# Patient Record
Sex: Female | Born: 2000 | Race: Black or African American | Hispanic: No | Marital: Single | Smoking: Never smoker
Health system: Southern US, Community
[De-identification: ages and names within clinical notes are randomized; demographics above are authoritative.]

## PROBLEM LIST (undated history)

## (undated) DIAGNOSIS — Z789 Other specified health status: Secondary | ICD-10-CM

## (undated) DIAGNOSIS — L309 Dermatitis, unspecified: Secondary | ICD-10-CM

## (undated) DIAGNOSIS — R569 Unspecified convulsions: Secondary | ICD-10-CM

## (undated) HISTORY — PX: OTHER SURGICAL HISTORY: SHX169

## (undated) HISTORY — DX: Dermatitis, unspecified: L30.9

## (undated) HISTORY — PX: FOOT SURGERY: SHX648

---
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
## 2018-04-25 ENCOUNTER — Other Ambulatory Visit: Payer: Self-pay

## 2018-04-25 ENCOUNTER — Encounter (HOSPITAL_COMMUNITY): Payer: Self-pay | Admitting: Emergency Medicine

## 2018-04-25 ENCOUNTER — Emergency Department (HOSPITAL_COMMUNITY)
Admission: EM | Admit: 2018-04-25 | Discharge: 2018-04-25 | Disposition: A | Discharge status: Home / Self Care | Payer: 59 | Attending: Emergency Medicine | Admitting: Emergency Medicine

## 2018-04-25 DIAGNOSIS — N760 Acute vaginitis: Secondary | ICD-10-CM | POA: Diagnosis not present

## 2018-04-25 DIAGNOSIS — N939 Abnormal uterine and vaginal bleeding, unspecified: Secondary | ICD-10-CM

## 2018-04-25 DIAGNOSIS — B9689 Other specified bacterial agents as the cause of diseases classified elsewhere: Secondary | ICD-10-CM | POA: Diagnosis not present

## 2018-04-25 LAB — CBC WITH DIFFERENTIAL/PLATELET
Abs Immature Granulocytes: 0 10*3/uL (ref 0.0–0.1)
BASOS PCT: 0 %
Basophils Absolute: 0 10*3/uL (ref 0.0–0.1)
EOS ABS: 0 10*3/uL (ref 0.0–0.7)
Eosinophils Relative: 1 %
HCT: 32.4 % — ABNORMAL LOW (ref 36.0–46.0)
Hemoglobin: 10.9 g/dL — ABNORMAL LOW (ref 12.0–15.0)
Immature Granulocytes: 0 %
Lymphocytes Relative: 23 %
Lymphs Abs: 1.5 10*3/uL (ref 0.7–4.0)
MCH: 29 pg (ref 26.0–34.0)
MCHC: 33.6 g/dL (ref 30.0–36.0)
MCV: 86.2 fL (ref 78.0–100.0)
MONOS PCT: 8 %
Monocytes Absolute: 0.5 10*3/uL (ref 0.1–1.0)
Neutro Abs: 4.4 10*3/uL (ref 1.7–7.7)
Neutrophils Relative %: 68 %
PLATELETS: 252 10*3/uL (ref 150–400)
RBC: 3.76 MIL/uL — ABNORMAL LOW (ref 3.87–5.11)
RDW: 12.3 % (ref 11.5–15.5)
WBC: 6.5 10*3/uL (ref 4.0–10.5)

## 2018-04-25 LAB — BASIC METABOLIC PANEL
Anion gap: 8 (ref 5–15)
BUN: 6 mg/dL (ref 6–20)
CALCIUM: 9.1 mg/dL (ref 8.9–10.3)
CO2: 25 mmol/L (ref 22–32)
CREATININE: 0.59 mg/dL (ref 0.44–1.00)
Chloride: 104 mmol/L (ref 101–111)
GFR calc non Af Amer: >60 mL/min (ref >60)
GLUCOSE: 82 mg/dL (ref 65–99)
Potassium: 3.5 mmol/L (ref 3.5–5.1)
Sodium: 137 mmol/L (ref 135–145)

## 2018-04-25 LAB — WET PREP, GENITAL
Sperm: NONE SEEN
Trich, Wet Prep: NONE SEEN
Yeast Wet Prep HPF POC: NONE SEEN

## 2018-04-25 LAB — I-STAT BETA HCG BLOOD, ED (MC, WL, AP ONLY): I-stat hCG, quantitative: <5 m[IU]/mL (ref <5)

## 2018-04-25 MED ORDER — METRONIDAZOLE 500 MG PO TABS: 500.0000 mg | ORAL_TABLET | Freq: Two times a day (BID) | ORAL | 0 refills | Status: DC

## 2018-04-25 MED ORDER — PROVERA 10 MG PO TABS: 20.0000 mg | ORAL_TABLET | Freq: Three times a day (TID) | ORAL | 0 refills | Status: DC

## 2018-04-25 NOTE — ED Provider Notes (Signed)
[facility] EMERGENCY DEPARTMENT Provider Note   CSN: [id number] Arrival date & time: 04/25/18  1813     History   Chief Complaint Chief Complaint  Patient presents with  . Vaginal Bleeding    HPI Lauren Adams is a 17 y.o. female.  HPI   17 year old female presents today with complaints of vaginal bleeding.  Patient notes she had 3 days of vaginal bleeding on April 21.  She notes this was not during her normal menstrual cycle.  She notes she came back on shortly after on April 28 and has had vaginal bleeding since that time.  Patient notes heavy vaginal bleeding every day, she notes that several pads per day.  She reports very minor abdominal cramping, no severe abdominal pain, no upper abdominal pain fever chills.  Patient notes an March she was very stressed, but notes since then she has not been.  She denies any history of the same.  She notes that she was on birth control last year but is no longer taking it.  Patient denies any vaginal discharge.  She reports last sexual activity in March with female partner.  She has a follow-up at the beginning of June with an OB/GYN.    Past Medical History:  Diagnosis Date  . Medical history non-contributory   . Seizures [facility])     Patient Active Problem List   Diagnosis Date Noted  . Conversion disorder with abnormal movement   . Adjustment disorder with other symptom   . VISUAL ACUITY, DECREASED 09/09/2010  . CONSTIPATION, INTERMITTENT 09/09/2010    No past surgical history on file.   OB History   None      Home Medications    Prior to Admission medications   Medication Sig Start Date End Date Taking? Authorizing Provider  metroNIDAZOLE (FLAGYL) 500 MG tablet Take 1 tablet (500 mg total) by mouth 2 (two) times daily. 04/25/18   Kalaysia Demonbreun, Tinnie Gens, PA-C  PROVERA 10 MG tablet Take 2 tablets (20 mg total) by mouth 3 (three) times daily. 04/25/18   Eyvonne Mechanic, PA-C    Family History Family History    Problem Relation Age of Onset  . Carpal tunnel syndrome Mother   . Hypertension Maternal Aunt   . Kidney disease Maternal Aunt   . Diabetes Maternal Aunt   . Diabetes Maternal Grandmother   . Hypertension Maternal Grandmother   . Kidney disease Maternal Grandmother     Social History Social History   Tobacco Use  . Smoking status: Never Smoker  . Smokeless tobacco: Never Used  Substance Use Topics  . Alcohol use: No    Alcohol/week: 0.0 oz  . Drug use: No     Allergies   Patient has no known allergies.   Review of Systems Review of Systems  All other systems reviewed and are negative.    Physical Exa Updated Vital Signs BP 107/67 (BP Location: Right Arm)   Pulse 81   Temp 98.1 F (36.7 C) (Oral)   Resp 16   Wt 70.8 kg (156 lb)   LMP 04/25/2018   SpO2 99%   Physical Exam  Constitutional: She is oriented to person, place, and time. She appears well-developed and well-nourished.  HENT:  Head: Normocephalic and atraumatic.  Eyes: Pupils are equal, round, and reactive to light. Conjunctivae are normal. Right eye exhibits no discharge. Left eye exhibits no discharge. No scleral icterus.  Neck: Normal range of motion. No JVD present. No tracheal deviation present.  Pulmonary/Chest: Effort normal. No stridor.  Genitourinary:  Genitourinary Comments: Blood noted in the vaginal vault with cervix is origin, no lacerations, no cervical motion tenderness adnexal tenderness or masses  Neurological: She is alert and oriented to person, place, and time. Coordination normal.  Psychiatric: She has a normal mood and affect. Her behavior is normal. Judgment and thought content normal.  Nursing note and vitals reviewed.    ED Treatments / Results  Labs (all labs ordered are listed, but only abnormal results are displayed) Labs Reviewed  WET PREP, GENITAL - Abnormal; Notable for the following components:      Result Value   Clue Cells Wet Prep HPF POC PRESENT (*)    WBC,  Wet Prep HPF POC MODERATE (*)    All other components within normal limits  CBC WITH DIFFERENTIAL/PLATELET - Abnormal; Notable for the following components:   RBC 3.76 (*)    Hemoglobin 10.9 (*)    HCT 32.4 (*)    All other components within normal limits  BASIC METABOLIC PANEL  I-STAT BETA HCG BLOOD, ED ([facility], [facility], [facility] ONLY)  GC/CHLAMYDIA PROBE AMP ([place]) NOT AT [facility]    EKG None  Radiology No results found.  Procedures Procedures (including critical care time)  Medications Ordered in ED Medications - No data to display   Initial Impression / Assessment and Plan / ED Course  I have reviewed the triage vital signs and the nursing notes.  Pertinent labs & imaging results that were available during my care of the patient were reviewed by me and considered in my medical decision making (see chart for details).     17 year old female presents today with vaginal bleeding is well-appearing in no acute distress.  She has a hemoglobin of 10.9, no recent for parison, she has no signs of significant blood loss.  Patient does have clue cells on wet prep and will be treated for bacterial vaginosis.  She has a negative pregnancy test.  Patient has no concerning signs or symptoms today, she will be treated with Provera for 1 week with close outpatient follow-up, she is given strict return precautions, she verbalized understanding and agreement to today's plan had no further questions or concerns.  Final Clinical Impressions(s) / ED Diagnoses   Final diagnoses:  Abnormal uterine bleeding  Bacterial vaginosis    ED Discharge Orders        Ordered    PROVERA 10 MG tablet  3 times daily     04/25/18 2150    metroNIDAZOLE (FLAGYL) 500 MG tablet  2 times daily     04/25/18 2159       Eyvonne Mechanic, PA-C 04/27/18 1926    Eber Hong, MD 04/29/18 [phone]

## 2018-04-25 NOTE — Discharge Instructions (Addendum)
Please read attached information. If you experience any new or worsening signs or symptoms please return to the emergency room for evaluation. Please follow-up with your primary care provider or specialist as discussed. Please use medication prescribed only as directed and discontinue taking if you have any concerning signs or symptoms.   °

## 2018-04-25 NOTE — ED Triage Notes (Signed)
Pt states she had a last normal period April 20th for 3 days. States on the 28th she started bleeding again and has been bleeding ever since, bright to dark red with occasional clots. Pt got of birth control last year. Last intercourse was in March. Pt reports occasional cramping.

## 2018-04-26 LAB — GC/CHLAMYDIA PROBE AMP (~~LOC~~) NOT AT ARMC
Chlamydia: NEGATIVE
Neisseria Gonorrhea: NEGATIVE

## 2018-05-11 ENCOUNTER — Encounter: Payer: Self-pay | Admitting: Obstetrics and Gynecology

## 2018-05-11 ENCOUNTER — Ambulatory Visit (INDEPENDENT_AMBULATORY_CARE_PROVIDER_SITE_OTHER): Payer: 59 | Admitting: Obstetrics and Gynecology

## 2018-05-11 VITALS — BP 103/65 | HR 74 | Ht 64.0 in | Wt 148.5 lb

## 2018-05-11 DIAGNOSIS — Z3009 Encounter for other general counseling and advice on contraception: Secondary | ICD-10-CM

## 2018-05-11 DIAGNOSIS — N939 Abnormal uterine and vaginal bleeding, unspecified: Secondary | ICD-10-CM | POA: Diagnosis not present

## 2018-05-11 DIAGNOSIS — Z113 Encounter for screening for infections with a predominantly sexual mode of transmission: Secondary | ICD-10-CM | POA: Diagnosis not present

## 2018-05-11 NOTE — Progress Notes (Signed)
Patient is in the office for irregular bleeding. Pt stated that she had irregular bleeding since 03-26-18, started Provera on 04-25-18 and bleeding stopped. Pt reports that very light bleeding started on the 3rd of this month since stopping provera. Pt is currently sexually active, considering Depo.

## 2018-05-11 NOTE — Progress Notes (Signed)
GYNECOLOGY OFFICE VISIT NOTE  History:  17 y.o. P0 here today for irregular bleeding. Had regular periods until April 2019, then started bleeding irregularly, had long period until 04/28/18. Then started having light bleeding on 05/08/18. Was on 3 month cycle CHCs until 08/2017 and was stopped due to "liver problems." Has been having unprotected intercourse, does not want her mother to know about this. Has not taken a pregnancy test.  Past Medical History:  Diagnosis Date  . Medical history non-contributory   . 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 No pertinent surgical history.   Current Outpatient Medications:  .  metroNIDAZOLE (FLAGYL) 500 MG tablet, Take 1 tablet (500 mg total) by mouth 2 (two) times daily. (Patient not taking: Reported on 05/11/2018), Disp: 14 tablet, Rfl: 0 .  PROVERA 10 MG tablet, Take 2 tablets (20 mg total) by mouth 3 (three) times daily. (Patient not taking: Reported on 05/11/2018), Disp: 42 tablet, Rfl: 0  The following portions of the patient's history were reviewed and updated as appropriate: allergies, current medications, past family history, past medical history, past social history, past surgical history and problem list.   Review of Systems:  Pertinent items noted in HPI and remainder of comprehensive ROS otherwise negative.   Objective:  Physical Exam BP 103/65   Pulse 74   Ht 5\' 4"  (1.626 m)   Wt 148 lb 8 oz (67.4 kg)   LMP 03/26/2018   BMI 25.49 kg/m  CONSTITUTIONAL: Well-developed, well-nourished female in no acute distress.  HENT:  Normocephalic, atraumatic. External right and left ear normal. Oropharynx is clear and moist EYES: Conjunctivae and EOM are normal. Pupils are equal, round, and reactive to light. No scleral icterus.  NECK: Normal range of motion, supple, no masses SKIN: Skin is warm and dry. No rash noted. Not diaphoretic. No erythema. No  pallor. NEUROLOGIC: Alert and oriented to person, place, and time. Normal reflexes, muscle tone coordination. No cranial nerve deficit noted. PSYCHIATRIC: Normal mood and affect. Normal behavior. Normal judgment and thought content. CARDIOVASCULAR: Normal heart rate noted RESPIRATORY: Effort normal, no problems with respiration noted ABDOMEN: Soft, no distention noted.   PELVIC: Deferred MUSCULOSKELETAL: Normal range of motion. No edema noted.  Labs and Imaging No results found.  Assessment & Plan:   1. Routine screening for STI (sexually transmitted infection) - recently had GC/CT which were negative - HIV antibody (with reflex) - RPR - Hepatitis B surface antigen - Hepatitis C Antibody -encouraged safe sex practices, using condoms, etc. and regular STI testing  2. Abnormal uterine bleeding (AUB) - Patient with 2 months irregular bleeding, reassured her this is common in her age group, particularly after coming off hormonal contraception. Reviewed options for management of AUB, including hormonal forms of contraception, she is with her mother, who doesn't seem to want patient to be on any form of hormonal contraception. Patient is undecided, does not want to start anything now - patient with ? H/o liver issues on CHC, normal LFTs here, have requested they send records from prior GYN  3. Encounter for counseling regarding contraception - Reviewed options for contraception, patient would like something but unsure, she did not want to discuss contraception extensively in front of her mother - Encouraged patient to use contraception and practice safe sex  Gave info regarding hormonal methods of controlling AUB and contraception, patient declines anything for now, will return when she decides on options  Routine preventative health maintenance measures emphasized. Please refer to After Visit Summary for other counseling recommendations.   Return in about 3 months (around 08/11/2018), or  if symptoms worsen or fail to improve.    Baldemar LenisK. Meryl Darrien Laakso, M.D. Attending Obstetrician & Gynecologist, [facility], [facility]

## 2018-05-12 LAB — RPR: RPR Ser Ql: NONREACTIVE

## 2018-05-12 LAB — HEPATITIS B SURFACE ANTIGEN: Hepatitis B Surface Ag: NEGATIVE

## 2018-05-12 LAB — HEPATITIS C ANTIBODY: Hep C Virus Ab: <0.1 s/co ratio (ref 0.0–0.9)

## 2018-05-12 LAB — HIV ANTIBODY (ROUTINE TESTING W REFLEX): HIV Screen 4th Generation wRfx: NONREACTIVE

## 2018-07-21 ENCOUNTER — Ambulatory Visit (INDEPENDENT_AMBULATORY_CARE_PROVIDER_SITE_OTHER): Payer: 59 | Admitting: Podiatry

## 2018-07-21 ENCOUNTER — Telehealth: Payer: Self-pay | Admitting: Podiatry

## 2018-07-21 ENCOUNTER — Ambulatory Visit (INDEPENDENT_AMBULATORY_CARE_PROVIDER_SITE_OTHER): Payer: 59

## 2018-07-21 VITALS — BP 110/68 | HR 67

## 2018-07-21 DIAGNOSIS — M25571 Pain in right ankle and joints of right foot: Secondary | ICD-10-CM | POA: Diagnosis not present

## 2018-07-21 DIAGNOSIS — M778 Other enthesopathies, not elsewhere classified: Secondary | ICD-10-CM

## 2018-07-21 DIAGNOSIS — R269 Unspecified abnormalities of gait and mobility: Secondary | ICD-10-CM | POA: Diagnosis not present

## 2018-07-21 DIAGNOSIS — M7751 Other enthesopathy of right foot: Secondary | ICD-10-CM

## 2018-07-21 DIAGNOSIS — M25371 Other instability, right ankle: Secondary | ICD-10-CM

## 2018-07-21 DIAGNOSIS — M779 Enthesopathy, unspecified: Secondary | ICD-10-CM

## 2018-07-21 NOTE — Telephone Encounter (Signed)
I was just seen this morning and the doctor said a medication would be at my pharmacy. I just went there and they don't have anything. Please call me back at [phone].

## 2018-07-28 MED ORDER — MELOXICAM 15 MG PO TABS
15.0000 mg | ORAL_TABLET | Freq: Every day | ORAL | 0 refills | Status: DC
Start: 2018-07-28 — End: 2018-12-18

## 2018-08-11 DIAGNOSIS — M7750 Other enthesopathy of unspecified foot: Secondary | ICD-10-CM

## 2018-08-11 DIAGNOSIS — M25371 Other instability, right ankle: Secondary | ICD-10-CM | POA: Diagnosis not present

## 2018-08-11 DIAGNOSIS — R269 Unspecified abnormalities of gait and mobility: Secondary | ICD-10-CM

## 2018-08-11 DIAGNOSIS — M25571 Pain in right ankle and joints of right foot: Secondary | ICD-10-CM | POA: Diagnosis not present

## 2018-08-11 NOTE — Progress Notes (Signed)
  Subjective:  Patient ID: Lauren Adams, female    DOB: 2001-01-23,  MRN: [id number]  Chief Complaint  Patient presents with  . Foot Pain    right foot and ankle pain    17 y.o. female presents with the above complaint. Reports chronic pain to the right ankle. Worse recently. Feels the ankle is unstable. Endorses history of sprains to the ankle.  Review of Systems: Negative except as noted in the HPI. Denies N/V/F/Ch.  Past Medical History:  Diagnosis Date  . Medical history non-contributory   . Seizures (HCC)     Current Outpatient Medications:  .  meloxicam (MOBIC) 15 MG tablet, Take 1 tablet (15 mg total) by mouth daily., Disp: 30 tablet, Rfl: 0 .  metroNIDAZOLE (FLAGYL) 500 MG tablet, Take 1 tablet (500 mg total) by mouth 2 (two) times daily. (Patient not taking: Reported on 05/11/2018), Disp: 14 tablet, Rfl: 0 .  PROVERA 10 MG tablet, Take 2 tablets (20 mg total) by mouth 3 (three) times daily. (Patient not taking: Reported on 05/11/2018), Disp: 42 tablet, Rfl: 0  Social History   Tobacco Use  Smoking Status Never Smoker  Smokeless Tobacco Never Used    No Known Allergies Objective:   Vitals:   07/21/18 1104  BP: 110/68  Pulse: 67   There is no height or weight on file to calculate BMI. Constitutional Well developed. Well nourished.  Vascular Dorsalis pedis pulses palpable bilaterally. Posterior tibial pulses palpable bilaterally. Capillary refill normal to all digits.  No cyanosis or clubbing noted. Pedal hair growth normal.  Neurologic Normal speech. Oriented to person, place, and time. Epicritic sensation to light touch grossly present bilaterally.  Dermatologic Nails well groomed and normal in appearance. No open wounds. No skin lesions.  Orthopedic: Normal joint ROM without pain or crepitus bilaterally. No visible deformities. POP R ATFL Increased R Ankle inversion noted.   Radiographs: Taken and reviewed. No acute fracture or  dislocation. Assessment:   1. Capsulitis of foot, right   2. Right ankle instability   3. Gait disturbance   4. Acute right ankle pain    Plan:  Patient was evaluated and treated and all questions answered.  R Ankle Instability -XR reviewed as above. -Referral placed to PT for strengthening and balance/proprioception. -Dispensed R ankle trilock brace  No follow-ups on file.

## 2018-12-18 ENCOUNTER — Other Ambulatory Visit: Payer: Self-pay

## 2018-12-18 ENCOUNTER — Emergency Department (HOSPITAL_COMMUNITY): Payer: 59

## 2018-12-18 ENCOUNTER — Emergency Department (HOSPITAL_COMMUNITY)
Admission: EM | Admit: 2018-12-18 | Discharge: 2018-12-18 | Disposition: A | Discharge status: Home / Self Care | Payer: 59 | Attending: Emergency Medicine | Admitting: Emergency Medicine

## 2018-12-18 ENCOUNTER — Encounter (HOSPITAL_COMMUNITY): Payer: Self-pay | Admitting: Emergency Medicine

## 2018-12-18 DIAGNOSIS — R0602 Shortness of breath: Secondary | ICD-10-CM

## 2018-12-18 DIAGNOSIS — R05 Cough: Secondary | ICD-10-CM

## 2018-12-18 DIAGNOSIS — J9801 Acute bronchospasm: Secondary | ICD-10-CM | POA: Diagnosis not present

## 2018-12-18 DIAGNOSIS — R059 Cough, unspecified: Secondary | ICD-10-CM

## 2018-12-18 LAB — BASIC METABOLIC PANEL
ANION GAP: 14 (ref 5–15)
BUN: 12 mg/dL (ref 6–20)
CO2: 23 mmol/L (ref 22–32)
Calcium: 9.3 mg/dL (ref 8.9–10.3)
Chloride: 102 mmol/L (ref 98–111)
Creatinine, Ser: 0.65 mg/dL (ref 0.44–1.00)
GFR calc Af Amer: >60 mL/min (ref >60)
GFR calc non Af Amer: >60 mL/min (ref >60)
Glucose, Bld: 100 mg/dL — ABNORMAL HIGH (ref 70–99)
Potassium: 3.2 mmol/L — ABNORMAL LOW (ref 3.5–5.1)
Sodium: 139 mmol/L (ref 135–145)

## 2018-12-18 LAB — CBC WITH DIFFERENTIAL/PLATELET
Abs Immature Granulocytes: 0.04 10*3/uL (ref 0.00–0.07)
Basophils Absolute: 0 10*3/uL (ref 0.0–0.1)
Basophils Relative: 0 %
Eosinophils Absolute: 0.1 10*3/uL (ref 0.0–0.5)
Eosinophils Relative: 1 %
HCT: 38.4 % (ref 36.0–46.0)
Hemoglobin: 12.7 g/dL (ref 12.0–15.0)
IMMATURE GRANULOCYTES: 1 %
Lymphocytes Relative: 28 %
Lymphs Abs: 2.2 10*3/uL (ref 0.7–4.0)
MCH: 28.9 pg (ref 26.0–34.0)
MCHC: 33.1 g/dL (ref 30.0–36.0)
MCV: 87.5 fL (ref 80.0–100.0)
Monocytes Absolute: 0.6 10*3/uL (ref 0.1–1.0)
Monocytes Relative: 8 %
Neutro Abs: 4.9 10*3/uL (ref 1.7–7.7)
Neutrophils Relative %: 62 %
Platelets: 231 10*3/uL (ref 150–400)
RBC: 4.39 MIL/uL (ref 3.87–5.11)
RDW: 11.5 % (ref 11.5–15.5)
WBC: 7.8 10*3/uL (ref 4.0–10.5)
nRBC: 0 % (ref 0.0–0.2)

## 2018-12-18 LAB — I-STAT BETA HCG BLOOD, ED (NOT ORDERABLE)

## 2018-12-18 LAB — D-DIMER, QUANTITATIVE (NOT AT ARMC): D DIMER QUANT: 0.37 ug{FEU}/mL (ref 0.00–0.50)

## 2018-12-18 MED ORDER — ALBUTEROL SULFATE (2.5 MG/3ML) 0.083% IN NEBU
5.0000 mg | INHALATION_SOLUTION | Freq: Once | RESPIRATORY_TRACT | Status: AC
  Administered 2018-12-18: 5 mg via RESPIRATORY_TRACT
  Filled 2018-12-18: qty 6

## 2018-12-18 MED ORDER — SODIUM CHLORIDE 0.9 % IV BOLUS (SEPSIS)
1000.0000 mL | Freq: Once | INTRAVENOUS | Status: AC
  Administered 2018-12-18: 1000 mL via INTRAVENOUS

## 2018-12-18 MED ORDER — POTASSIUM CHLORIDE CRYS ER 20 MEQ PO TBCR
40.0000 meq | EXTENDED_RELEASE_TABLET | Freq: Once | ORAL | Status: AC
  Administered 2018-12-18: 40 meq via ORAL
  Filled 2018-12-18: qty 2

## 2018-12-18 MED ORDER — ALBUTEROL SULFATE HFA 108 (90 BASE) MCG/ACT IN AERS
2.0000 | INHALATION_SPRAY | Freq: Once | RESPIRATORY_TRACT | Status: AC
  Administered 2018-12-18: 2 via RESPIRATORY_TRACT
  Filled 2018-12-18: qty 6.7

## 2018-12-18 MED ORDER — LORAZEPAM 2 MG/ML IJ SOLN
1.0000 mg | Freq: Once | INTRAMUSCULAR | Status: AC
  Administered 2018-12-18: 1 mg via INTRAVENOUS
  Filled 2018-12-18: qty 1

## 2018-12-18 NOTE — ED Provider Notes (Signed)
[facility]-EMERGENCY DEPT Provider Note   CSN: [id number] Arrival date & time: 12/18/18  0120     History   Chief Complaint Chief Complaint  Patient presents with  . Shortness of Breath    HPI Lauren Adams is a 18 y.o. female.  The history is provided by the patient.  Shortness of Breath  Severity:  Moderate Onset quality:  Gradual Timing:  Intermittent Progression:  Worsening Chronicity:  New Relieved by:  Nothing Worsened by:  Nothing Associated symptoms: no fever and no vomiting   Risk factors: no hx of PE/DVT   Patient presents with cough and shortness of breath.  She reports about a week ago she began having cough and congestion and body aches.  She reports productive cough with yellow sputum, no hemoptysis. Earlier in the day, she began feeling hot and lightheaded.  She reports increased shortness of breath.  While in the ER she reports that she had some chest tightness and short of breath. Other than OTC meds she takes no other medicines regularly. She is an occasional smoker.  Past Medical History:  Diagnosis Date  . Medical history non-contributory   . Seizures [facility])     Patient Active Problem List   Diagnosis Date Noted  . Conversion disorder with abnormal movement   . Adjustment disorder with other symptom   . VISUAL ACUITY, DECREASED 09/09/2010  . CONSTIPATION, INTERMITTENT 09/09/2010    History reviewed. No pertinent surgical history.   OB History   No obstetric history on file.      Home Medications    Prior to Admission medications   Not on File    Family History Family History  Problem Relation Age of Onset  . Carpal tunnel syndrome Mother   . Hypertension Maternal Aunt   . Kidney disease Maternal Aunt   . Diabetes Maternal Aunt   . Diabetes Maternal Grandmother   . Hypertension Maternal Grandmother   . Kidney disease Maternal Grandmother     Social History Social History   Tobacco Use  . Smoking  status: Never Smoker  . Smokeless tobacco: Never Used  Substance Use Topics  . Alcohol use: No    Alcohol/week: 0.0 standard drinks  . Drug use: No     Allergies   Patient has no known allergies.   Review of Systems Review of Systems  Constitutional: Negative for fever.  Respiratory: Positive for shortness of breath.   Cardiovascular: Negative for leg swelling.  Gastrointestinal: Negative for vomiting.  All other systems reviewed and are negative.    Physical Exam Updated Vital Signs BP 105/74 (BP Location: Right Arm)   Pulse 99   Temp 97.8 F (36.6 C) (Oral)   Resp 14   Ht 1.638 m (5' 4.5")   Wt 70.8 kg   LMP 12/05/2018 (Exact Date)   SpO2 98%   BMI 26.36 kg/m   Physical Exam CONSTITUTIONAL: Well developed/well nourished, anxious HEAD: Normocephalic/atraumatic EYES: EOMI/PERRL ENMT: Mucous membranes moist, uvula midline, no erythema/exudates NECK: supple no meningeal signs SPINE/BACK:entire spine nontender CV: S1/S2 noted, no murmurs/rubs/gallops noted LUNGS: scattered wheeze noted, mild tachypnea, no acute distress ABDOMEN: soft, nontender  NEURO: Pt is awake/alert/appropriate, moves all extremitiesx4.  No facial droop.   EXTREMITIES: pulses normal/equal, full ROM, no LE edema or tenderness SKIN: warm, color normal PSYCH: anxious   ED Treatments / Results  Labs (all labs ordered are listed, but only abnormal results are displayed) Labs Reviewed  BASIC METABOLIC PANEL - Abnormal; Notable  for the following components:      Result Value   Potassium 3.2 (*)    Glucose, Bld 100 (*)    All other components within normal limits  CBC WITH DIFFERENTIAL/PLATELET  D-DIMER, QUANTITATIVE (NOT AT [facility])  I-STAT BETA HCG BLOOD, ED (NOT ORDERABLE)    EKG EKG Interpretation  Date/Time:  Monday December 18 2018 04:25:44 EST Ventricular Rate:  85 PR Interval:    QRS Duration: 71 QT Interval:  371 QTC Calculation: 442 R Axis:   55 Text Interpretation:  Sinus  arrhythmia Confirmed by Zadie Rhine ([id number]) on 12/18/2018 4:43:14 AM   Radiology Dg Chest 2 View  Result Date: 12/18/2018 CLINICAL DATA:  Cough, fever, body aches EXAM: CHEST - 2 VIEW COMPARISON:  01/14/2017 FINDINGS: Lungs are clear.  No pleural effusion or pneumothorax. The heart is normal in size. Visualized osseous structures are within normal limits. IMPRESSION: Normal chest radiographs. Electronically Signed   By: Charline Bills M.D.   On: 12/18/2018 01:46    Procedures  Procedures    Medications Ordered in ED Medications  potassium chloride SA (K-DUR,KLOR-CON) CR tablet 40 mEq (has no administration in time range)  albuterol (PROVENTIL) (2.5 MG/3ML) 0.083% nebulizer solution 5 mg (5 mg Nebulization Given 12/18/18 0140)  albuterol (PROVENTIL HFA;VENTOLIN HFA) 108 (90 Base) MCG/ACT inhaler 2 puff (2 puffs Inhalation Given 12/18/18 0428)  sodium chloride 0.9 % bolus 1,000 mL (1,000 mLs Intravenous New Bag/Given 12/18/18 0538)  LORazepam (ATIVAN) injection 1 mg (1 mg Intravenous Given 12/18/18 0538)     Initial Impression / Assessment and Plan / ED Course  I have reviewed the triage vital signs and the nursing notes.  Pertinent imaging results that were available during my care of the patient were reviewed by me and considered in my medical decision making (see chart for details).     4:17 AM Patient reports recent cough congestion of the past week with increasing shortness of breath today.  She appears anxious on my exam.  She does have scattered wheezing on exam.  Will give albuterol and reassess 5:27 AM On repeat assessment patient was getting orthostatic vital signs done and her heart rate jumped up to the 130s.  She appears tachypneic.  She reports her pain is worsening.  She had to sit down and started grabbing her head and appeared very anxious. Labs, IV fluids and Ativan has been ordered 6:36 AM Patient appears improved.  No hypoxia, tachycardia has improved She is  in no distress, resting comfortably. Suspect symptoms may be related to recent flulike illness causing bronchospasm. Advised to continue albuterol at home. Labs reassuring, PE has been ruled out Mild hypokalemia, patient will be given potassium Final Clinical Impressions(s) / ED Diagnoses   Final diagnoses:  SOB (shortness of breath)  Cough  Acute bronchospasm    ED Discharge Orders    None       Zadie Rhine, MD 12/18/18 [phone]

## 2018-12-18 NOTE — ED Notes (Signed)
While in room, Pt began making some gasping breaths and complained of some chest pain. Bebe Shaggy, MD at bedside was made aware.

## 2018-12-18 NOTE — ED Triage Notes (Signed)
Pt reports productive cough with body aches and fever for the last week. Pt reports yellow sputum.

## 2018-12-22 ENCOUNTER — Telehealth: Payer: Self-pay | Admitting: *Deleted

## 2018-12-22 DIAGNOSIS — M25371 Other instability, right ankle: Secondary | ICD-10-CM

## 2018-12-22 DIAGNOSIS — M779 Enthesopathy, unspecified: Principal | ICD-10-CM

## 2018-12-22 DIAGNOSIS — M25571 Pain in right ankle and joints of right foot: Secondary | ICD-10-CM

## 2018-12-22 DIAGNOSIS — R269 Unspecified abnormalities of gait and mobility: Secondary | ICD-10-CM

## 2018-12-22 DIAGNOSIS — M778 Other enthesopathies, not elsewhere classified: Secondary | ICD-10-CM

## 2018-12-22 NOTE — Telephone Encounter (Signed)
Lauren Adams asked for up dated orders for PT. Dr. Marylouise Stacks renewal of PT.

## 2019-02-14 ENCOUNTER — Encounter: Payer: Self-pay | Admitting: Allergy and Immunology

## 2019-02-14 ENCOUNTER — Other Ambulatory Visit: Payer: Self-pay

## 2019-02-14 ENCOUNTER — Ambulatory Visit (INDEPENDENT_AMBULATORY_CARE_PROVIDER_SITE_OTHER): Payer: Medicaid Other | Admitting: Allergy and Immunology

## 2019-02-14 VITALS — BP 106/72 | HR 90 | Temp 98.2°F | Resp 16 | Ht 65.1 in | Wt 159.0 lb

## 2019-02-14 DIAGNOSIS — T7840XA Allergy, unspecified, initial encounter: Secondary | ICD-10-CM | POA: Insufficient documentation

## 2019-02-14 DIAGNOSIS — H1013 Acute atopic conjunctivitis, bilateral: Secondary | ICD-10-CM | POA: Diagnosis not present

## 2019-02-14 DIAGNOSIS — T7840XD Allergy, unspecified, subsequent encounter: Secondary | ICD-10-CM

## 2019-02-14 DIAGNOSIS — J3089 Other allergic rhinitis: Secondary | ICD-10-CM | POA: Diagnosis not present

## 2019-02-14 DIAGNOSIS — J302 Other seasonal allergic rhinitis: Secondary | ICD-10-CM | POA: Insufficient documentation

## 2019-02-14 DIAGNOSIS — H1010 Acute atopic conjunctivitis, unspecified eye: Secondary | ICD-10-CM | POA: Insufficient documentation

## 2019-02-14 DIAGNOSIS — T783XXD Angioneurotic edema, subsequent encounter: Secondary | ICD-10-CM

## 2019-02-14 DIAGNOSIS — T783XXA Angioneurotic edema, initial encounter: Secondary | ICD-10-CM | POA: Insufficient documentation

## 2019-02-14 MED ORDER — FLUTICASONE PROPIONATE 50 MCG/ACT NA SUSP: NASAL | 5 refills | Status: DC

## 2019-02-14 MED ORDER — EPINEPHRINE 0.3 MG/0.3ML IJ SOAJ: 0.3000 mg | Freq: Once | INTRAMUSCULAR | 1 refills | Status: DC

## 2019-02-14 MED ORDER — LEVOCETIRIZINE DIHYDROCHLORIDE 5 MG PO TABS: 5.0000 mg | ORAL_TABLET | Freq: Every evening | ORAL | 5 refills | Status: DC

## 2019-02-14 MED ORDER — EPINEPHRINE 0.3 MG/0.3ML IJ SOAJ: 0.3000 mg | Freq: Once | INTRAMUSCULAR | 1 refills | Status: AC

## 2019-02-14 MED ORDER — OLOPATADINE HCL 0.7 % OP SOLN: 1.0000 [drp] | Freq: Every day | OPHTHALMIC | 5 refills | Status: DC | PRN

## 2019-02-14 NOTE — Progress Notes (Signed)
New Patient Note  RE: Lauren Adams MRN: [id number] DOB: [birth year] Date of Office Visit: 02/14/2019  Referring provider: Maudie Flakes, FNP Primary care provider: Patient, No Pcp Per  Chief Complaint: Allergic Reaction, Angioedema   History of present illness: Lauren Adams is a 18 y.o. female seen today in consultation requested by Azzie Roup, FNP.  She reports that she has had 3 or 4 episodes of lip swelling.  The initial episode occurred approximately 3 or 4 montss ago.  These episodes have occurred 1 time per month on average and have no correlation with her menstrual cycle.  During the most recent episode was approximately 1 month ago.  On 1 or 2 occasions she had mild swelling of the tongue resulting in slurred speech and on one occasion she had swelling of the left hand.  She did not experience concomitant urticaria, cardiopulmonary symptoms, or GI symptoms.  She reports that she experienced tingling of the lips prior to the onset of swelling.  She took diphenhydramine and the symptoms resolved over the course of the next 3-4 days.  She had a similar episode occur approximately 1 year ago.  She has no family history of angioedema, has never been on an ACE inhibitor, was not experiencing increased emotional stress, and had not been taking NSAIDs, and did not have symptoms consistent with viral syndrome prior to the onset of symptoms.  She experiences frequent nasal congestion, rhinorrhea, sneezing, nasal pruritus, and ocular pruritus.  These symptoms are most frequent and severe during the springtime and in the fall.  She attempted to control the symptoms with diphenhydramine.   Assessment and plan: Angioedema Unclear etiology.  Food allergen skin testing was negative today despite a positive histamine control.  The patient is not taking an ACE inhibitor. NSAIDs may exacerbate angioedema but in this case are not the underlying etiology as demonstrated by the fact that the  patient has experienced angioedema in the absence of NSAIDs. There are no concomitant symptoms concerning for anaphylaxis or constitutional symptoms worrisome for an underlying malignancy.  The patient has had no associated urticaria.  We will order labs to rule out hereditary angioedema, acquired angioedema, urticaria associated angioedema, and other potential etiologies.   The following labs have been ordered: Antithyroglobulin antibody, thyroid peroxidase antibody, FCeRI antibody, tryptase, C4, C1 esterase inhibitor (quantitative and functional), C1q, factor XII, CBC, CMP, and serum specific IgE E level to alpha gal.  The patient will be notified with further recommendations after lab results have returned.  Should symptoms recur, a  journal is to be kept recording any foods eaten, beverages consumed, medications taken, activities performed, and environmental conditions within a 6 hour period prior to the onset of symptoms. For any symptoms concerning for anaphylaxis, epinephrine is to be administered and 911 is to be called immediately.  A prescription has been provided for epinephrine 0.3 mg autoinjector (EpiPen) 2 pack along with instructions for its proper administration.  Seasonal and perennial allergic rhinitis  Aeroallergen avoidance measures have been discussed and provided in written form.  A prescription has been provided for levocetirizine, 5 mg daily as needed.  A prescription has been provided for fluticasone nasal spray, 2 sprays per nostril daily as needed. Proper nasal spray technique has been discussed and demonstrated.  Nasal saline spray (i.e., Simply Saline) or nasal saline lavage (i.e., NeilMed) is recommended as needed and prior to medicated nasal sprays.  If allergen avoidance measures and medications fail to adequately relieve symptoms, aeroallergen immunotherapy  will be considered.  Allergic conjunctivitis  Treatment plan as outlined above for allergic  rhinitis.  A prescription has been provided for Pazeo, one drop per eye daily as needed.  I have also recommended eye lubricant drops (i.e., Natural Tears) as needed.   Meds ordered this encounter  Medications   EPINEPHrine 0.3 mg/0.3 mL IJ SOAJ injection    Sig: Inject 0.3 mLs (0.3 mg total) into the muscle once for 1 dose.    Dispense:  4 Device    Refill:  1   levocetirizine (XYZAL) 5 MG tablet    Sig: Take 1 tablet (5 mg total) by mouth every evening.    Dispense:  30 tablet    Refill:  5   fluticasone (FLONASE) 50 MCG/ACT nasal spray    Sig: 2 sprays per nostril daily as needed    Dispense:  18.2 g    Refill:  5   Olopatadine HCl (PAZEO) 0.7 % SOLN    Sig: Place 1 drop into both eyes daily as needed.    Dispense:  1 Bottle    Refill:  5    Diagnostics: Environmental skin testing: Robust reactivity to grass pollen, weed pollen, ragweed pollen, and tree pollen.  Positive to dog epithelia. Food allergen skin testing: Negative despite a positive histamine control.    Physical examination: Blood pressure 106/72, pulse 90, temperature 98.2 F (36.8 C), temperature source Oral, resp. rate 16, height 5' 5.1" (1.654 m), weight 159 lb (72.1 kg), SpO2 98 %.  General: Alert, interactive, in no acute distress. HEENT: TMs pearly gray, turbinates moderately edematous without discharge, post-pharynx moderately erythematous. Neck: Supple without lymphadenopathy. Lungs: Clear to auscultation without wheezing, rhonchi or rales. CV: Normal S1, S2 without murmurs. Abdomen: Nondistended, nontender. Skin: Warm and dry, without lesions or rashes. Extremities:  No clubbing, cyanosis or edema. Neuro:   Grossly intact.  Review of systems:  Review of systems negative except as noted in HPI / PMHx or noted below: Review of Systems  Constitutional: Negative.   HENT: Negative.   Eyes: Negative.   Respiratory: Negative.   Cardiovascular: Negative.   Gastrointestinal: Negative.    Genitourinary: Negative.   Musculoskeletal: Negative.   Skin: Negative.   Neurological: Negative.   Endo/Heme/Allergies: Negative.   Psychiatric/Behavioral: Negative.     Past medical history:  Past Medical History:  Diagnosis Date   Eczema    Medical history non-contributory    Seizures (HCC)     Past surgical history:  Past Surgical History:  Procedure Laterality Date   no surgical history      Family history: Family History  Problem Relation Age of Onset   Carpal tunnel syndrome Mother    Hypertension Maternal Aunt    Kidney disease Maternal Aunt    Diabetes Maternal Aunt    Diabetes Maternal Grandmother    Hypertension Maternal Grandmother    Kidney disease Maternal Grandmother    Asthma Other    Eczema Other    Angioedema Neg Hx    Immunodeficiency Neg Hx    Urticaria Neg Hx     Social history: Social History   Socioeconomic History   Marital status: Single    Spouse name: Not on file   Number of children: Not on file   Years of education: Not on file   Highest education level: Not on file  Occupational History   Not on file  Social Needs   Financial resource strain: Not on file   Food insecurity:  Worry: Not on file    Inability: Not on file   Transportation needs:    Medical: Not on file    Non-medical: Not on file  Tobacco Use   Smoking status: Never Smoker   Smokeless tobacco: Never Used  Substance and Sexual Activity   Alcohol use: Never    Alcohol/week: 0.0 standard drinks    Frequency: Never   Drug use: Not Currently    Types: Marijuana   Sexual activity: Yes    Birth control/protection: None  Lifestyle   Physical activity:    Days per week: Not on file    Minutes per session: Not on file   Stress: Not on file  Relationships   Social connections:    Talks on phone: Not on file    Gets together: Not on file    Attends religious service: Not on file    Active member of club or organization:  Not on file    Attends meetings of clubs or organizations: Not on file    Relationship status: Not on file   Intimate partner violence:    Fear of current or ex partner: Not on file    Emotionally abused: Not on file    Physically abused: Not on file    Forced sexual activity: Not on file  Other Topics Concern   Not on file  Social History Narrative   Lives with Celine Ahr, Kateri Mc (maternal).  Pt has 2 brothers but do not live in the house with her.  No pets in the house; No smokers in the house.   Environmental History: Patient lives in a house with hardwood floors throughout and central air/heat.  There is no known mold/water damage in the home.  She is a cigarette smoker.  There are no pets in the home.  Allergies as of 02/14/2019   No Known Allergies     Medication List       Accurate as of February 14, 2019  3:31 PM. Always use your most recent med list.        diphenhydrAMINE 25 MG tablet Commonly known as:  BENADRYL Take 25 mg by mouth every 6 (six) hours as needed.   EPINEPHrine 0.3 mg/0.3 mL Soaj injection Commonly known as:  EPI-PEN Inject 0.3 mLs (0.3 mg total) into the muscle once for 1 dose.   fluticasone 50 MCG/ACT nasal spray Commonly known as:  FLONASE 2 sprays per nostril daily as needed   ibuprofen 200 MG tablet Commonly known as:  ADVIL,MOTRIN Take 200 mg by mouth every 6 (six) hours as needed.   levocetirizine 5 MG tablet Commonly known as:  XYZAL Take 1 tablet (5 mg total) by mouth every evening.   Olopatadine HCl 0.7 % Soln Commonly known as:  Pazeo Place 1 drop into both eyes daily as needed.       Known medication allergies: No Known Allergies  I appreciate the opportunity to take part in [place] care. Please do not hesitate to contact me with questions.  Sincerely,   R. Jorene Guest, MD

## 2019-02-14 NOTE — Assessment & Plan Note (Deleted)
   Treatment plan as outlined above for allergic rhinitis.  A prescription has been provided for Pazeo, one drop per eye daily as needed.  I have also recommended eye lubricant drops (i.e., Natural Tears) as needed. 

## 2019-02-14 NOTE — Addendum Note (Signed)
Addended by: Florence Canner on: 02/14/2019 04:05 PM   Modules accepted: Orders

## 2019-02-14 NOTE — Assessment & Plan Note (Deleted)
   Aeroallergen avoidance measures have been discussed and provided in written form.  A prescription has been provided for levocetirizine, 5 mg daily as needed.  A prescription has been provided for fluticasone nasal spray, 2 sprays per nostril daily as needed. Proper nasal spray technique has been discussed and demonstrated.  Nasal saline spray (i.e., Simply Saline) or nasal saline lavage (i.e., NeilMed) is recommended as needed and prior to medicated nasal sprays.  If allergen avoidance measures and medications fail to adequately relieve symptoms, aeroallergen immunotherapy will be considered. 

## 2019-02-14 NOTE — Assessment & Plan Note (Addendum)
Unclear etiology.  Food allergen skin testing was negative today despite a positive histamine control.  The patient is not taking an ACE inhibitor. NSAIDs may exacerbate angioedema but in this case are not the underlying etiology as demonstrated by the fact that the patient has experienced angioedema in the absence of NSAIDs. There are no concomitant symptoms concerning for anaphylaxis or constitutional symptoms worrisome for an underlying malignancy.  The patient has had no associated urticaria.  We will order labs to rule out hereditary angioedema, acquired angioedema, urticaria associated angioedema, and other potential etiologies.   The following labs have been ordered: Antithyroglobulin antibody, thyroid peroxidase antibody, FCeRI antibody, tryptase, C4, C1 esterase inhibitor (quantitative and functional), C1q, factor XII, CBC, CMP, and serum specific IgE E level to alpha gal.  The patient will be notified with further recommendations after lab results have returned.  Should symptoms recur, a  journal is to be kept recording any foods eaten, beverages consumed, medications taken, activities performed, and environmental conditions within a 6 hour period prior to the onset of symptoms. For any symptoms concerning for anaphylaxis, epinephrine is to be administered and 911 is to be called immediately.  A prescription has been provided for epinephrine 0.3 mg autoinjector (EpiPen) 2 pack along with instructions for its proper administration.

## 2019-02-14 NOTE — Assessment & Plan Note (Signed)
   Aeroallergen avoidance measures have been discussed and provided in written form.  A prescription has been provided for levocetirizine, 5 mg daily as needed.  A prescription has been provided for fluticasone nasal spray, 2 sprays per nostril daily as needed. Proper nasal spray technique has been discussed and demonstrated.  Nasal saline spray (i.e., Simply Saline) or nasal saline lavage (i.e., NeilMed) is recommended as needed and prior to medicated nasal sprays.  If allergen avoidance measures and medications fail to adequately relieve symptoms, aeroallergen immunotherapy will be considered. 

## 2019-02-14 NOTE — Patient Instructions (Addendum)
Angioedema Unclear etiology.  Food allergen skin testing was negative today despite a positive histamine control.  The patient is not taking an ACE inhibitor. NSAIDs may exacerbate angioedema but in this case are not the underlying etiology as demonstrated by the fact that the patient has experienced angioedema in the absence of NSAIDs. There are no concomitant symptoms concerning for anaphylaxis or constitutional symptoms worrisome for an underlying malignancy.  The patient has had no associated urticaria.  We will order labs to rule out hereditary angioedema, acquired angioedema, urticaria associated angioedema, and other potential etiologies.   The following labs have been ordered: Antithyroglobulin antibody, thyroid peroxidase antibody, FCeRI antibody, tryptase, C4, C1 esterase inhibitor (quantitative and functional), C1q, factor XII, CBC, CMP, and serum specific IgE E level to alpha gal.  The patient will be notified with further recommendations after lab results have returned.  Should symptoms recur, a  journal is to be kept recording any foods eaten, beverages consumed, medications taken, activities performed, and environmental conditions within a 6 hour period prior to the onset of symptoms. For any symptoms concerning for anaphylaxis, epinephrine is to be administered and 911 is to be called immediately.  A prescription has been provided for epinephrine 0.3 mg autoinjector (EpiPen) 2 pack along with instructions for its proper administration.  Seasonal and perennial allergic rhinitis  Aeroallergen avoidance measures have been discussed and provided in written form.  A prescription has been provided for levocetirizine, 5 mg daily as needed.  A prescription has been provided for fluticasone nasal spray, 2 sprays per nostril daily as needed. Proper nasal spray technique has been discussed and demonstrated.  Nasal saline spray (i.e., Simply Saline) or nasal saline lavage (i.e., NeilMed) is  recommended as needed and prior to medicated nasal sprays.  If allergen avoidance measures and medications fail to adequately relieve symptoms, aeroallergen immunotherapy will be considered.  Allergic conjunctivitis  Treatment plan as outlined above for allergic rhinitis.  A prescription has been provided for Pazeo, one drop per eye daily as needed.  I have also recommended eye lubricant drops (i.e., Natural Tears) as needed.   When lab results have returned the patient will be called with further recommendations and follow up instructions.  Reducing Pollen Exposure  The American Academy of Allergy, Asthma and Immunology suggests the following steps to reduce your exposure to pollen during allergy seasons.    1. Do not hang sheets or clothing out to dry; pollen may collect on these items. 2. Do not mow lawns or spend time around freshly cut grass; mowing stirs up pollen. 3. Keep windows closed at night.  Keep car windows closed while driving. 4. Minimize morning activities outdoors, a time when pollen counts are usually at their highest. 5. Stay indoors as much as possible when pollen counts or humidity is high and on windy days when pollen tends to remain in the air longer. 6. Use air conditioning when possible.  Many air conditioners have filters that trap the pollen spores. 7. Use a HEPA room air filter to remove pollen form the indoor air you breathe.   Control of Dog or Cat Allergen  Avoidance is the best way to manage a dog or cat allergy. If you have a dog or cat and are allergic to dog or cats, consider removing the dog or cat from the home. If you have a dog or cat but don't want to find it a new home, or if your family wants a pet even though someone in the  household is allergic, here are some strategies that may help keep symptoms at bay:  1. Keep the pet out of your bedroom and restrict it to only a few rooms. Be advised that keeping the dog or cat in only one room will  not limit the allergens to that room. 2. Don't pet, hug or kiss the dog or cat; if you do, wash your hands with soap and water. 3. High-efficiency particulate air (HEPA) cleaners run continuously in a bedroom or living room can reduce allergen levels over time. 4. Place electrostatic material sheet in the air inlet vent in the bedroom. 5. Regular use of a high-efficiency vacuum cleaner or a central vacuum can reduce allergen levels. 6. Giving your dog or cat a bath at least once a week can reduce airborne allergen.

## 2019-02-14 NOTE — Assessment & Plan Note (Signed)
   Treatment plan as outlined above for allergic rhinitis.  A prescription has been provided for Pazeo, one drop per eye daily as needed.  I have also recommended eye lubricant drops (i.e., Natural Tears) as needed. 

## 2019-02-15 ENCOUNTER — Ambulatory Visit: Payer: Self-pay | Admitting: Allergy & Immunology

## 2019-02-24 LAB — THYROID PEROXIDASE ANTIBODY: Thyroperoxidase Ab SerPl-aCnc: 10 IU/mL (ref 0–26)

## 2019-02-24 LAB — COMPREHENSIVE METABOLIC PANEL
ALT: 8 IU/L (ref 0–32)
AST: 15 IU/L (ref 0–40)
Albumin/Globulin Ratio: 1.7 (ref 1.2–2.2)
Albumin: 4.5 g/dL (ref 3.9–5.0)
Alkaline Phosphatase: 54 IU/L (ref 39–117)
BUN/Creatinine Ratio: 14 (ref 9–23)
BUN: 9 mg/dL (ref 6–20)
Bilirubin Total: 0.2 mg/dL (ref 0.0–1.2)
CO2: 25 mmol/L (ref 20–29)
Calcium: 9.8 mg/dL (ref 8.7–10.2)
Chloride: 100 mmol/L (ref 96–106)
Creatinine, Ser: 0.66 mg/dL (ref 0.57–1.00)
GFR calc Af Amer: 148 mL/min/{1.73_m2} (ref >59)
GFR calc non Af Amer: 128 mL/min/{1.73_m2} (ref >59)
Globulin, Total: 2.7 g/dL (ref 1.5–4.5)
Glucose: 93 mg/dL (ref 65–99)
Potassium: 4.1 mmol/L (ref 3.5–5.2)
Sodium: 139 mmol/L (ref 134–144)
Total Protein: 7.2 g/dL (ref 6.0–8.5)

## 2019-02-24 LAB — CBC WITH DIFFERENTIAL/PLATELET
Basophils Absolute: 0 10*3/uL (ref 0.0–0.2)
Basos: 1 %
EOS (ABSOLUTE): 0 10*3/uL (ref 0.0–0.4)
Eos: 0 %
Hematocrit: 37.4 % (ref 34.0–46.6)
Hemoglobin: 12.4 g/dL (ref 11.1–15.9)
Immature Grans (Abs): 0 10*3/uL (ref 0.0–0.1)
Immature Granulocytes: 0 %
Lymphocytes Absolute: 1.8 10*3/uL (ref 0.7–3.1)
Lymphs: 35 %
MCH: 29.3 pg (ref 26.6–33.0)
MCHC: 33.2 g/dL (ref 31.5–35.7)
MCV: 88 fL (ref 79–97)
Monocytes Absolute: 0.4 10*3/uL (ref 0.1–0.9)
Monocytes: 7 %
Neutrophils Absolute: 3 10*3/uL (ref 1.4–7.0)
Neutrophils: 57 %
Platelets: 244 10*3/uL (ref 150–450)
RBC: 4.23 x10E6/uL (ref 3.77–5.28)
RDW: 12.7 % (ref 11.7–15.4)
WBC: 5.2 10*3/uL (ref 3.4–10.8)

## 2019-02-24 LAB — ALPHA-GAL PANEL
Alpha Gal IgE*: <0.1 kU/L (ref <0.10)
Beef (Bos spp) IgE: <0.1 kU/L (ref <0.35)
Class Interpretation: 0
Class Interpretation: 0
Class Interpretation: 0
Lamb/Mutton (Ovis spp) IgE: <0.1 kU/L (ref <0.35)
Pork (Sus spp) IgE: <0.1 kU/L (ref <0.35)

## 2019-02-24 LAB — C1 ESTERASE INHIBITOR: C1INH SerPl-mCnc: 22 mg/dL (ref 21–39)

## 2019-02-24 LAB — THYROGLOBULIN ANTIBODY: Thyroglobulin Antibody: <1 IU/mL (ref 0.0–0.9)

## 2019-02-24 LAB — C1 ESTERASE INHIBITOR, FUNCTIONAL: C1INH Functional/C1INH Total MFr SerPl: 82 %mean normal

## 2019-02-24 LAB — FACTOR 12 ASSAY: Factor XII Activity: 121 % (ref 50–150)

## 2019-02-24 LAB — C4 COMPLEMENT: Complement C4, Serum: 27 mg/dL (ref 14–44)

## 2019-02-24 LAB — TRYPTASE: Tryptase: 3.5 ug/L (ref 2.2–13.2)

## 2019-02-24 LAB — CHRONIC URTICARIA: cu index: 3.6 (ref <10)

## 2019-02-24 LAB — COMPLEMENT COMPONENT C1Q: Complement C1Q: 14 mg/dL (ref 10.3–20.5)

## 2019-12-20 IMAGING — CR DG CHEST 2V
2 series · 2 of 2 positions shown · non-contrast
Comparison: 01/14/2017

CLINICAL DATA: Cough, fever, body aches

EXAM:
CHEST - 2 VIEW

[w chest pa]
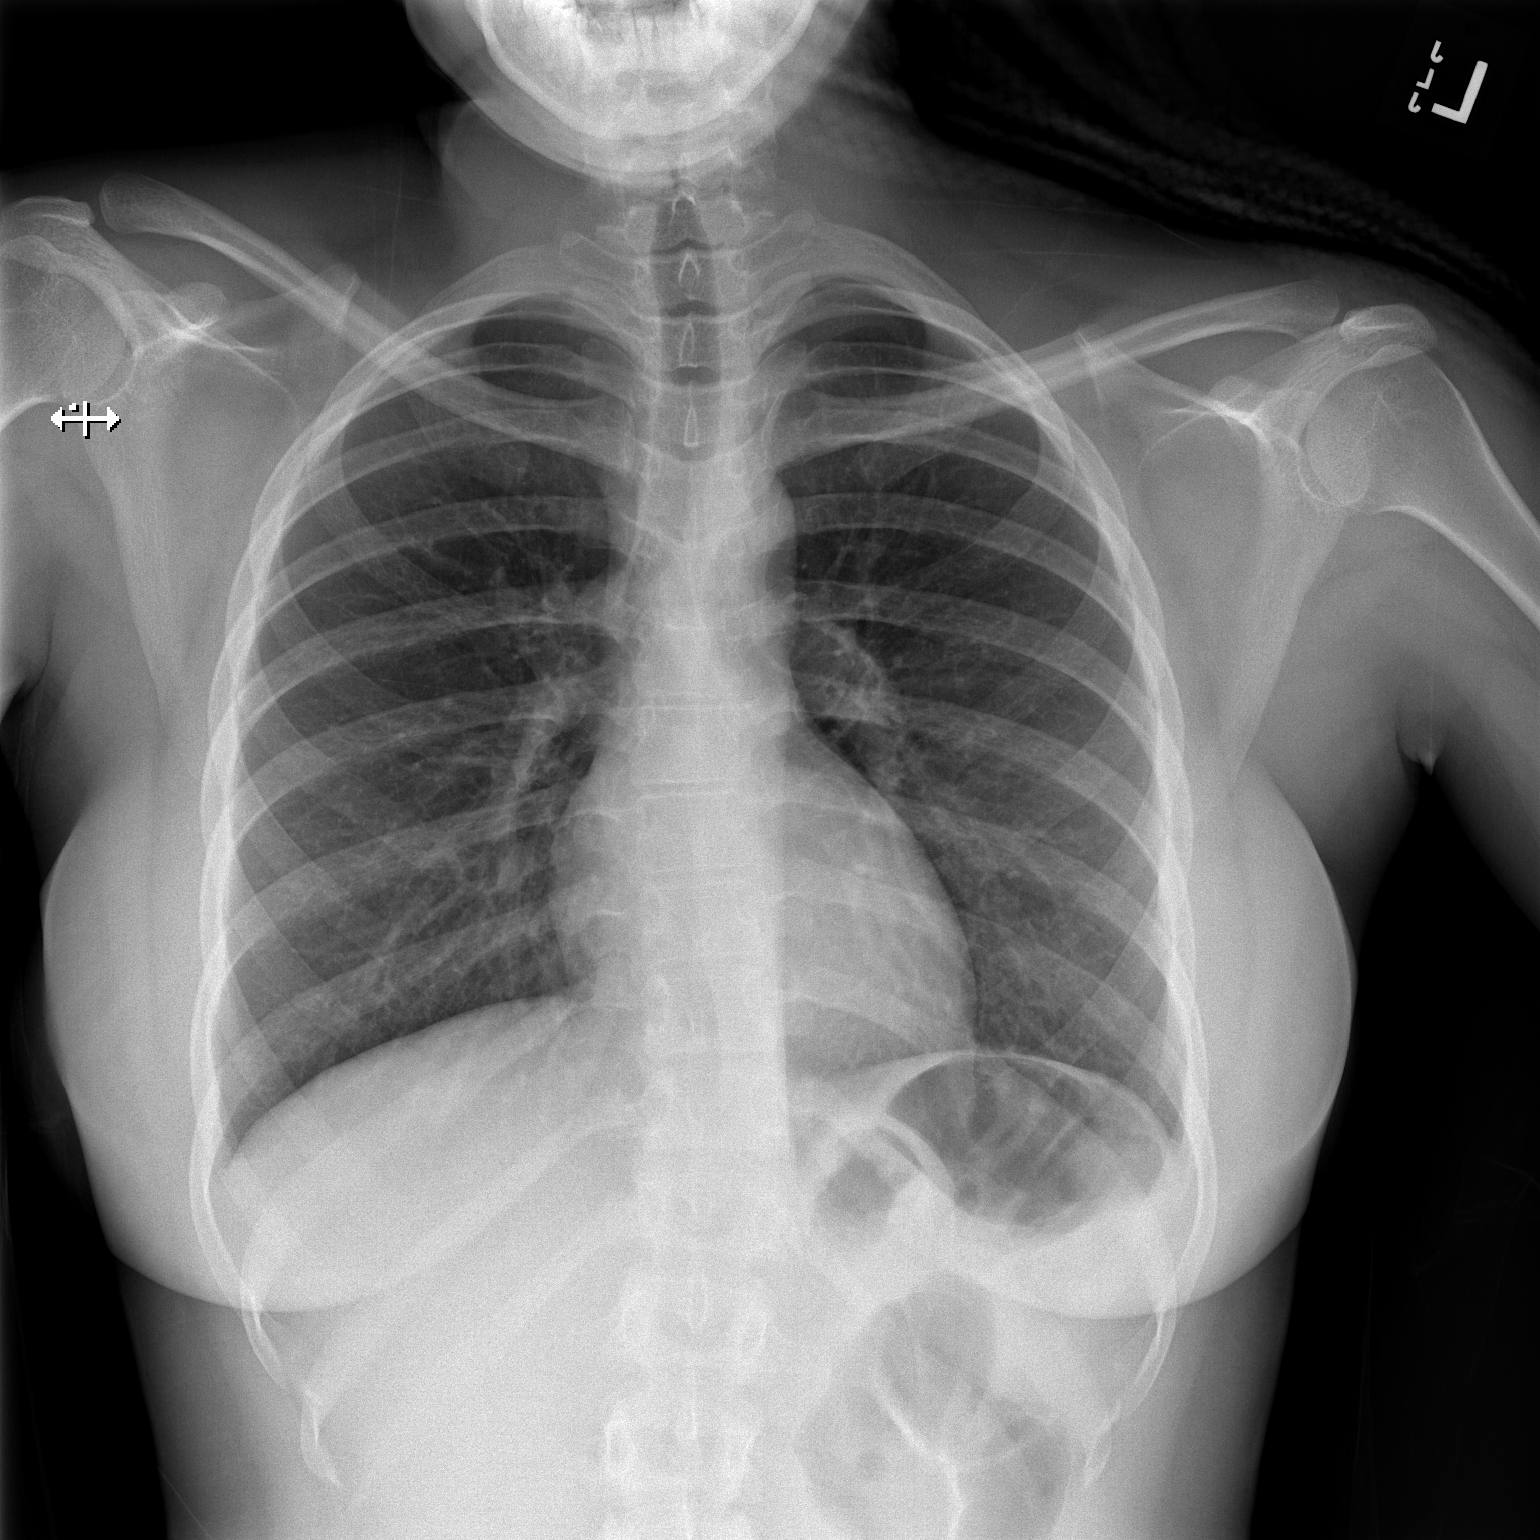

[w chest lat]
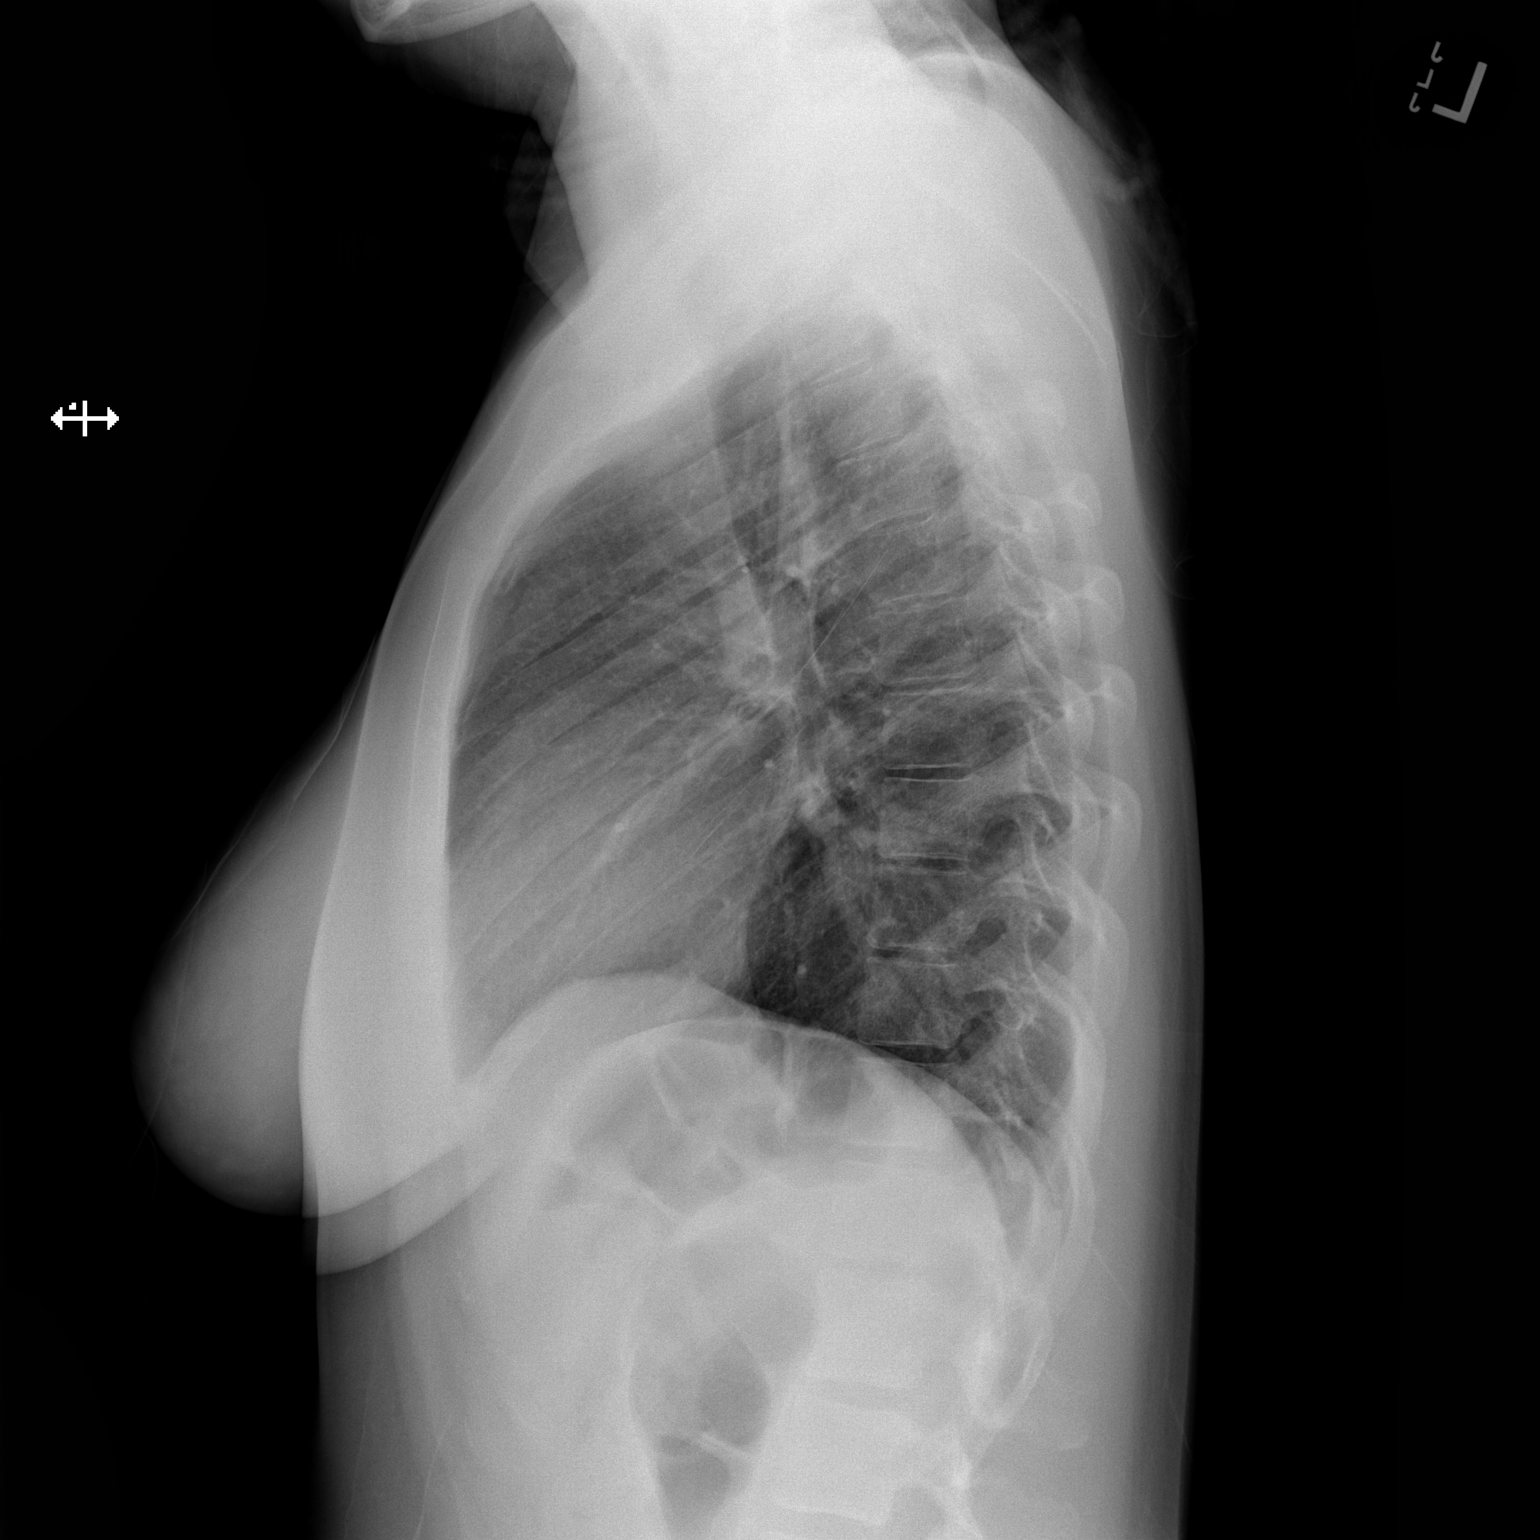

[2 of 2 positions shown; findings below may reference images not displayed]

FINDINGS: Lungs are clear.  No pleural effusion or pneumothorax.

The heart is normal in size.

Visualized osseous structures are within normal limits.
IMPRESSION: Normal chest radiographs.

## 2020-02-04 ENCOUNTER — Emergency Department (HOSPITAL_COMMUNITY): Payer: Medicaid Other

## 2020-02-04 ENCOUNTER — Emergency Department (HOSPITAL_COMMUNITY)
Admission: EM | Admit: 2020-02-04 | Discharge: 2020-02-05 | Disposition: A | Discharge status: Home / Self Care | Payer: Medicaid Other | Attending: Emergency Medicine | Admitting: Emergency Medicine

## 2020-02-04 ENCOUNTER — Encounter (HOSPITAL_COMMUNITY): Payer: Self-pay | Admitting: Emergency Medicine

## 2020-02-04 DIAGNOSIS — E860 Dehydration: Secondary | ICD-10-CM | POA: Insufficient documentation

## 2020-02-04 DIAGNOSIS — R110 Nausea: Secondary | ICD-10-CM | POA: Insufficient documentation

## 2020-02-04 DIAGNOSIS — R000 Tachycardia, unspecified: Secondary | ICD-10-CM

## 2020-02-04 DIAGNOSIS — R42 Dizziness and giddiness: Secondary | ICD-10-CM | POA: Diagnosis present

## 2020-02-04 LAB — CBC
HCT: 39.2 % (ref 36.0–46.0)
Hemoglobin: 12.9 g/dL (ref 12.0–15.0)
MCH: 28.8 pg (ref 26.0–34.0)
MCHC: 32.9 g/dL (ref 30.0–36.0)
MCV: 87.5 fL (ref 80.0–100.0)
Platelets: 268 10*3/uL (ref 150–400)
RBC: 4.48 MIL/uL (ref 3.87–5.11)
RDW: 11.9 % (ref 11.5–15.5)
WBC: 6.4 10*3/uL (ref 4.0–10.5)
nRBC: 0 % (ref 0.0–0.2)

## 2020-02-04 LAB — I-STAT CHEM 8, ED
BUN: 7 mg/dL (ref 6–20)
Calcium, Ion: 1.26 mmol/L (ref 1.15–1.40)
Chloride: 102 mmol/L (ref 98–111)
Creatinine, Ser: 0.7 mg/dL (ref 0.44–1.00)
Glucose, Bld: 91 mg/dL (ref 70–99)
HCT: 38 % (ref 36.0–46.0)
Hemoglobin: 12.9 g/dL (ref 12.0–15.0)
Potassium: 4 mmol/L (ref 3.5–5.1)
Sodium: 139 mmol/L (ref 135–145)
TCO2: 28 mmol/L (ref 22–32)

## 2020-02-04 LAB — COMPREHENSIVE METABOLIC PANEL
ALT: 14 U/L (ref 0–44)
AST: 18 U/L (ref 15–41)
Albumin: 3.9 g/dL (ref 3.5–5.0)
Alkaline Phosphatase: 50 U/L (ref 38–126)
Anion gap: 10 (ref 5–15)
BUN: 7 mg/dL (ref 6–20)
CO2: 25 mmol/L (ref 22–32)
Calcium: 9.2 mg/dL (ref 8.9–10.3)
Chloride: 103 mmol/L (ref 98–111)
Creatinine, Ser: 0.67 mg/dL (ref 0.44–1.00)
GFR calc Af Amer: >60 mL/min (ref >60)
GFR calc non Af Amer: >60 mL/min (ref >60)
Glucose, Bld: 99 mg/dL (ref 70–99)
Potassium: 4 mmol/L (ref 3.5–5.1)
Sodium: 138 mmol/L (ref 135–145)
Total Bilirubin: 0.4 mg/dL (ref 0.3–1.2)
Total Protein: 7 g/dL (ref 6.5–8.1)

## 2020-02-04 LAB — DIFFERENTIAL
Abs Immature Granulocytes: 0.02 10*3/uL (ref 0.00–0.07)
Basophils Absolute: 0 10*3/uL (ref 0.0–0.1)
Basophils Relative: 0 %
Eosinophils Absolute: 0 10*3/uL (ref 0.0–0.5)
Eosinophils Relative: 1 %
Immature Granulocytes: 0 %
Lymphocytes Relative: 33 %
Lymphs Abs: 2.1 10*3/uL (ref 0.7–4.0)
Monocytes Absolute: 0.5 10*3/uL (ref 0.1–1.0)
Monocytes Relative: 7 %
Neutro Abs: 3.7 10*3/uL (ref 1.7–7.7)
Neutrophils Relative %: 59 %

## 2020-02-04 LAB — I-STAT BETA HCG BLOOD, ED (MC, WL, AP ONLY): I-stat hCG, quantitative: <5 m[IU]/mL (ref <5)

## 2020-02-04 LAB — PROTIME-INR
INR: 1 (ref 0.8–1.2)
Prothrombin Time: 13.1 seconds (ref 11.4–15.2)

## 2020-02-04 LAB — APTT: aPTT: 29 seconds (ref 24–36)

## 2020-02-04 MED ORDER — SODIUM CHLORIDE 0.9% FLUSH: 3.0000 mL | Freq: Once | INTRAVENOUS | Status: DC

## 2020-02-04 NOTE — ED Triage Notes (Signed)
Pt arrives [facility] from [facility] with a c/o dizziness since waking this am.  20 gauge r/a. Pt is also nausea. Dizziness has been constant.

## 2020-02-05 LAB — URINALYSIS, ROUTINE W REFLEX MICROSCOPIC
Bilirubin Urine: NEGATIVE
Glucose, UA: NEGATIVE mg/dL
Hgb urine dipstick: NEGATIVE
Ketones, ur: NEGATIVE mg/dL
Nitrite: NEGATIVE
Protein, ur: NEGATIVE mg/dL
Specific Gravity, Urine: 1.024 (ref 1.005–1.030)
pH: 6 (ref 5.0–8.0)

## 2020-02-05 LAB — CBG MONITORING, ED
Glucose-Capillary: 62 mg/dL — ABNORMAL LOW (ref 70–99)
Glucose-Capillary: 76 mg/dL (ref 70–99)

## 2020-02-05 MED ORDER — SODIUM CHLORIDE 0.9 % IV BOLUS
2000.0000 mL | Freq: Once | INTRAVENOUS | Status: AC
  Administered 2020-02-05: 2000 mL via INTRAVENOUS

## 2020-02-05 NOTE — ED Provider Notes (Signed)
[facility] EMERGENCY DEPARTMENT Provider Note   CSN: [id number] Arrival date & time: 02/04/20  1824     History Chief Complaint  Patient presents with  . Dizziness    LINNETTE Adams is a 19 y.o. female.  Patient without significant medical problems presents with lightheadedness that was present when she got up this morning. She denies any symptoms yesterday, that it was a "normal" day and went to bed at her usual time. She describes feeling dizzy like she is going to pass out, but denies syncope, and symptoms worsen with standing and walking. They do not resolve with rest but they improve. No pain, headache, visual change. She went to Urgent Care and was referred to the emergency department with concern that she exhibited left sided weakness. No recent illness, nausea, vomiting, diarrhea, fever, SOB, cough.   The history is provided by the patient. No language interpreter was used.  Dizziness Associated symptoms: nausea   Associated symptoms: no chest pain, no headaches, no shortness of breath, no vomiting and no weakness        Past Medical History:  Diagnosis Date  . Eczema   . Medical history non-contributory   . Seizures [facility])     Patient Active Problem List   Diagnosis Date Noted  . 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 Allergic reaction 02/14/2019  . Allergic conjunctivitis 02/14/2019  . Seasonal and perennial allergic rhinitis 02/14/2019  . Conversion disorder with abnormal movement   . Adjustment disorder with other symptom   . VISUAL ACUITY, DECREASED 09/09/2010  . CONSTIPATION, INTERMITTENT 09/09/2010    Past Surgical History:  Procedure Laterality Date  . no surgical history       OB History   No obstetric history on file.     Family History  Problem Relation Age of Onset  . Carpal tunnel syndrome Mother   . Hypertension Maternal Aunt   . Kidney disease Maternal Aunt   . Diabetes Maternal Aunt   . Diabetes Maternal Grandmother   .  Hypertension Maternal Grandmother   . Kidney disease Maternal Grandmother   . Asthma Other   . Eczema Other   . Angioedema Neg Hx   . Immunodeficiency Neg Hx   . Urticaria Neg Hx     Social History   Tobacco Use  . Smoking status: Never Smoker  . Smokeless tobacco: Never Used  Substance Use Topics  . Alcohol use: Never    Alcohol/week: 0.0 standard drinks  . Drug use: Not Currently    Types: Marijuana    Home Medications Prior to Admission medications   Medication Sig Start Date End Date Taking? Authorizing Provider  acetaminophen (TYLENOL) 500 MG tablet Take 1,500 mg by mouth every 8 (eight) hours as needed for moderate pain.   Yes [provider]  fluticasone (FLONASE) 50 MCG/ACT nasal spray 2 sprays per nostril daily as needed Patient taking differently: Place 2 sprays into both nostrils daily as needed for allergies. 2 sprays per nostril daily as needed 02/14/19  Yes Bobbitt, Heywood Iles, MD  ibuprofen (ADVIL,MOTRIN) 200 MG tablet Take 400-600 mg by mouth every 6 (six) hours as needed for moderate pain.    Yes [provider]  levocetirizine (XYZAL) 5 MG tablet Take 1 tablet (5 mg total) by mouth every evening. Patient taking differently: Take 5 mg by mouth daily as needed for allergies.  02/14/19  Yes Bobbitt, Heywood Iles, MD  Olopatadine HCl (PAZEO) 0.7 % SOLN Place 1 drop into both eyes  daily as needed. Patient taking differently: Place 1 drop into both eyes daily as needed (eye allergies).  02/14/19  Yes Bobbitt, Sedalia Muta, MD    Allergies    Patient has no known allergies.  Review of Systems   Review of Systems  Constitutional: Negative for chills and fever.  HENT: Negative.   Eyes: Negative for visual disturbance.  Respiratory: Negative.  Negative for shortness of breath.   Cardiovascular: Negative.  Negative for chest pain.  Gastrointestinal: Positive for nausea. Negative for abdominal pain and vomiting.  Genitourinary: Negative.     Musculoskeletal: Negative.   Skin: Negative.   Neurological: Positive for dizziness. Negative for tremors, syncope, speech difficulty, weakness and headaches.    Physical Exam Updated Vital Signs BP 100/69 (BP Location: Left Arm)   Pulse 60   Temp 97.9 F (36.6 C) (Oral)   Resp 15   SpO2 100%   Physical Exam Vitals and nursing note reviewed.  Constitutional:      Appearance: She is well-developed.  HENT:     Head: Normocephalic.  Cardiovascular:     Rate and Rhythm: Normal rate and regular rhythm.     Heart sounds: No murmur.  Pulmonary:     Effort: Pulmonary effort is normal.     Breath sounds: Normal breath sounds. No wheezing, rhonchi or rales.  Abdominal:     General: Bowel sounds are normal.     Palpations: Abdomen is soft.     Tenderness: There is no abdominal tenderness. There is no guarding or rebound.  Musculoskeletal:        General: Normal range of motion.     Cervical back: Normal range of motion and neck supple.  Skin:    General: Skin is warm and dry.     Findings: No rash.  Neurological:     Mental Status: She is alert and oriented to person, place, and time.     Comments: CN's 3-12 grossly intact. Speech is clear and focused. No facial asymmetry. No lateralizing weakness. No deficits of coordination. There is subjective left sided sensory deficits to light touch, "it just feels different". Ambulatory without imbalance. There is left lateral nystagmus.      ED Results / Procedures / Treatments   Labs (all labs ordered are listed, but only abnormal results are displayed) Labs Reviewed  CBG MONITORING, ED - Abnormal; Notable for the following components:      Result Value   Glucose-Capillary 62 (*)    All other components within normal limits  PROTIME-INR  APTT  CBC  DIFFERENTIAL  COMPREHENSIVE METABOLIC PANEL  URINALYSIS, ROUTINE W REFLEX MICROSCOPIC  I-STAT CHEM 8, ED  I-STAT BETA HCG BLOOD, ED ([facility], [facility], [facility] ONLY)   Results for orders placed  or performed during the hospital encounter of 02/04/20  Protime-INR  Result Value Ref Range   Prothrombin Time 13.1 11.4 - 15.2 seconds   INR 1.0 0.8 - 1.2  APTT  Result Value Ref Range   aPTT 29 24 - 36 seconds  CBC  Result Value Ref Range   WBC 6.4 4.0 - 10.5 K/uL   RBC 4.48 3.87 - 5.11 MIL/uL   Hemoglobin 12.9 12.0 - 15.0 g/dL   HCT 39.2 36.0 - 46.0 %   MCV 87.5 80.0 - 100.0 fL   MCH 28.8 26.0 - 34.0 pg   MCHC 32.9 30.0 - 36.0 g/dL   RDW 11.9 11.5 - 15.5 %   Platelets 268 150 - 400 K/uL   nRBC  0.0 0.0 - 0.2 %  Differential  Result Value Ref Range   Neutrophils Relative % 59 %   Neutro Abs 3.7 1.7 - 7.7 K/uL   Lymphocytes Relative 33 %   Lymphs Abs 2.1 0.7 - 4.0 K/uL   Monocytes Relative 7 %   Monocytes Absolute 0.5 0.1 - 1.0 K/uL   Eosinophils Relative 1 %   Eosinophils Absolute 0.0 0.0 - 0.5 K/uL   Basophils Relative 0 %   Basophils Absolute 0.0 0.0 - 0.1 K/uL   Immature Granulocytes 0 %   Abs Immature Granulocytes 0.02 0.00 - 0.07 K/uL  Comprehensive metabolic panel  Result Value Ref Range   Sodium 138 135 - 145 mmol/L   Potassium 4.0 3.5 - 5.1 mmol/L   Chloride 103 98 - 111 mmol/L   CO2 25 22 - 32 mmol/L   Glucose, Bld 99 70 - 99 mg/dL   BUN 7 6 - 20 mg/dL   Creatinine, Ser 7.02 0.44 - 1.00 mg/dL   Calcium 9.2 8.9 - 63.7 mg/dL   Total Protein 7.0 6.5 - 8.1 g/dL   Albumin 3.9 3.5 - 5.0 g/dL   AST 18 15 - 41 U/L   ALT 14 0 - 44 U/L   Alkaline Phosphatase 50 38 - 126 U/L   Total Bilirubin 0.4 0.3 - 1.2 mg/dL   GFR calc non Af Amer >60 >60 mL/min   GFR calc Af Amer >60 >60 mL/min   Anion gap 10 5 - 15  Urinalysis, Routine w reflex microscopic  Result Value Ref Range   Color, Urine YELLOW YELLOW   APPearance HAZY (A) CLEAR   Specific Gravity, Urine 1.024 1.005 - 1.030   pH 6.0 5.0 - 8.0   Glucose, UA NEGATIVE NEGATIVE mg/dL   Hgb urine dipstick NEGATIVE NEGATIVE   Bilirubin Urine NEGATIVE NEGATIVE   Ketones, ur NEGATIVE NEGATIVE mg/dL   Protein, ur  NEGATIVE NEGATIVE mg/dL   Nitrite NEGATIVE NEGATIVE   Leukocytes,Ua TRACE (A) NEGATIVE   RBC / HPF 0-5 0 - 5 RBC/hpf   WBC, UA 0-5 0 - 5 WBC/hpf   Bacteria, UA RARE (A) NONE SEEN   Squamous Epithelial / LPF 11-20 0 - 5   Mucus PRESENT   I-stat chem 8, ED  Result Value Ref Range   Sodium 139 135 - 145 mmol/L   Potassium 4.0 3.5 - 5.1 mmol/L   Chloride 102 98 - 111 mmol/L   BUN 7 6 - 20 mg/dL   Creatinine, Ser 8.58 0.44 - 1.00 mg/dL   Glucose, Bld 91 70 - 99 mg/dL   Calcium, Ion 8.50 2.77 - 1.40 mmol/L   TCO2 28 22 - 32 mmol/L   Hemoglobin 12.9 12.0 - 15.0 g/dL   HCT 41.2 87.8 - 67.6 %  CBG monitoring, ED  Result Value Ref Range   Glucose-Capillary 62 (L) 70 - 99 mg/dL  I-Stat beta hCG blood, ED  Result Value Ref Range   I-stat hCG, quantitative <5.0 <5 mIU/mL   Comment 3          CBG monitoring, ED  Result Value Ref Range   Glucose-Capillary 76 70 - 99 mg/dL    EKG None  Radiology CT HEAD WO CONTRAST  Result Date: 02/04/2020 CLINICAL DATA:  Dizziness EXAM: CT HEAD WITHOUT CONTRAST TECHNIQUE: Contiguous axial images were obtained from the base of the skull through the vertex without intravenous contrast. COMPARISON:  2016 FINDINGS: Brain: There is no acute intracranial hemorrhage, mass-effect, or edema. Gray-white  differentiation is preserved. There is no extra-axial fluid collection. Ventricles and sulci are within normal limits in size and configuration. Vascular: No hyperdense vessel or unexpected calcification. Skull: Calvarium is unremarkable. Sinuses/Orbits: No acute finding. Other: None. IMPRESSION: No acute intracranial abnormality. Electronically Signed   By: Guadlupe Spanish M.D.   On: 02/04/2020 20:27    Procedures Procedures (including critical care time)  Medications Ordered in ED Medications  sodium chloride flush (NS) 0.9 % injection 3 mL (has no administration in time range)  sodium chloride 0.9 % bolus 2,000 mL (has no administration in time range)    ED  Course  I have reviewed the triage vital signs and the nursing notes.  Pertinent labs & imaging results that were available during my care of the patient were reviewed by me and considered in my medical decision making (see chart for details).    MDM Rules/Calculators/A&P                      Patient to ED with one day of positional lightheadedness without pain, SOB, fever. She reports nausea without vomiting.   Labs are essentially unremarkable. Her blood pressure is low on arrival at 101/79. Orthostatics show a decrease to 97/88 with standing (HR 62 increased to 84). She is given 2 liters of fluids and these were repeated after fluids: 94/57 increased to 108/74 (HR 60 increase to 120). Patient is asymptomatic after fluids, including with orthostatic measurements.   CBG decreased during stay to 61. She is drinking juice and has eaten a meal. CBG increased to 76.   POTS considered. She will be referred to cardiology for further evaluation. This was explained to the patient.      Final Clinical Impression(s) / ED Diagnoses Final diagnoses:  None   1. Dizziness 2. Dehydration   Rx / DC Orders ED Discharge Orders    None       Elpidio Anis, PA-C 02/05/20 [id number]    Shon Baton, MD 02/05/20 [phone]

## 2020-02-05 NOTE — ED Notes (Signed)
CBG Results of 62 reported to [place], Charity fundraiser.

## 2020-02-05 NOTE — Discharge Instructions (Addendum)
Schedule an appointment with cardiology for further evaluation of your low blood pressure and high heart rate.   Return to the emergency department with any new or concerning symptoms.

## 2020-02-05 NOTE — ED Notes (Signed)
Patient eating/drinking to increase blood sugar and orthostatic VS completed.

## 2020-02-26 ENCOUNTER — Other Ambulatory Visit (HOSPITAL_COMMUNITY)
Admission: RE | Admit: 2020-02-26 | Discharge: 2020-02-26 | Disposition: A | Discharge status: Home / Self Care | Payer: Medicaid Other | Source: Ambulatory Visit | Attending: Advanced Practice Midwife | Admitting: Advanced Practice Midwife

## 2020-02-26 ENCOUNTER — Ambulatory Visit (INDEPENDENT_AMBULATORY_CARE_PROVIDER_SITE_OTHER): Payer: Medicaid Other

## 2020-02-26 ENCOUNTER — Other Ambulatory Visit: Payer: Self-pay

## 2020-02-26 VITALS — BP 116/76 | HR 66 | Ht 65.0 in | Wt 169.5 lb

## 2020-02-26 DIAGNOSIS — Z113 Encounter for screening for infections with a predominantly sexual mode of transmission: Secondary | ICD-10-CM

## 2020-02-26 NOTE — Progress Notes (Signed)
Patient seen and assessed by nursing staff during this encounter. I have reviewed the chart and agree with the documentation and plan. I have also made any necessary editorial changes.  Thressa Sheller DNP, CNM  02/26/20  4:01 PM

## 2020-02-26 NOTE — Progress Notes (Deleted)
  Pt here for STD screening,advised results will show in My Chart. Patient verbalized understanding.

## 2020-02-27 ENCOUNTER — Telehealth: Payer: Self-pay | Admitting: *Deleted

## 2020-02-27 LAB — HEPATITIS C ANTIBODY: Hep C Virus Ab: <0.1 s/co ratio (ref 0.0–0.9)

## 2020-02-27 LAB — HEPATITIS B SURFACE ANTIGEN: Hepatitis B Surface Ag: NEGATIVE

## 2020-02-27 LAB — HIV ANTIBODY (ROUTINE TESTING W REFLEX): HIV Screen 4th Generation wRfx: NONREACTIVE

## 2020-02-27 LAB — RPR: RPR Ser Ql: NONREACTIVE

## 2020-02-27 NOTE — Telephone Encounter (Addendum)
Pt left VM stating that she has questions about her test results. Please call back. I returned pt's call and answered her question regarding Hepatitis C result. I confirmed that so far all test results are negative (normal). If there is any abnormality with the results from vaginal swab, she will be contacted regarding treatment. Pt voiced understanding.

## 2020-02-28 ENCOUNTER — Telehealth: Payer: Self-pay | Admitting: *Deleted

## 2020-02-28 ENCOUNTER — Telehealth: Payer: Self-pay | Admitting: Lactation Services

## 2020-02-28 LAB — CERVICOVAGINAL ANCILLARY ONLY
Bacterial Vaginitis (gardnerella): POSITIVE — AB
Candida Glabrata: NEGATIVE
Candida Vaginitis: NEGATIVE
Chlamydia: NEGATIVE
Comment: NEGATIVE
Comment: NEGATIVE
Comment: NEGATIVE
Comment: NEGATIVE
Comment: NEGATIVE
Comment: NORMAL
Neisseria Gonorrhea: NEGATIVE
Trichomonas: NEGATIVE

## 2020-02-28 MED ORDER — METRONIDAZOLE 500 MG PO TABS: 500.0000 mg | ORAL_TABLET | Freq: Two times a day (BID) | ORAL | 0 refills | Status: DC

## 2020-02-28 NOTE — Telephone Encounter (Signed)
Called patient to inform her that her vaginal swab showed she has BV. Will send in prescription and send patient a My Chart message.

## 2020-02-28 NOTE — Telephone Encounter (Signed)
Pt left VM message with additional questions after receiving message from Bricelyn of test results. I called pt and discussed her concern. She stated that she has been taking pH pills and wants to know if she should stop taking them. I advised pt to stop those pills and take the metronidazole as prescribed. She should contact the office if she has sx of BV in the future. Pt voiced understanding.

## 2020-03-11 NOTE — Progress Notes (Addendum)
  Pt here for STD screening,advised results will show in My Chart. Patient verbalized understanding.  Chart reviewed for nurse visit. Agree with plan of care.   Currie Paris, NP 03/18/2020 2:57 PM

## 2020-03-11 NOTE — Progress Notes (Signed)
Opened in error

## 2020-04-21 ENCOUNTER — Ambulatory Visit: Payer: Medicaid Other | Admitting: Women's Health

## 2020-05-08 ENCOUNTER — Encounter: Payer: Self-pay | Admitting: Obstetrics and Gynecology

## 2020-05-08 ENCOUNTER — Other Ambulatory Visit: Payer: Self-pay

## 2020-05-08 ENCOUNTER — Ambulatory Visit (INDEPENDENT_AMBULATORY_CARE_PROVIDER_SITE_OTHER): Payer: Medicaid Other | Admitting: Obstetrics and Gynecology

## 2020-05-08 ENCOUNTER — Other Ambulatory Visit (HOSPITAL_COMMUNITY)
Admission: RE | Admit: 2020-05-08 | Discharge: 2020-05-08 | Disposition: A | Discharge status: Home / Self Care | Payer: Medicaid Other | Source: Ambulatory Visit | Attending: Obstetrics and Gynecology | Admitting: Obstetrics and Gynecology

## 2020-05-08 VITALS — BP 103/67 | HR 71 | Ht 65.0 in | Wt 169.8 lb

## 2020-05-08 DIAGNOSIS — Z01411 Encounter for gynecological examination (general) (routine) with abnormal findings: Secondary | ICD-10-CM | POA: Insufficient documentation

## 2020-05-08 DIAGNOSIS — Z113 Encounter for screening for infections with a predominantly sexual mode of transmission: Secondary | ICD-10-CM | POA: Insufficient documentation

## 2020-05-08 DIAGNOSIS — Z01419 Encounter for gynecological examination (general) (routine) without abnormal findings: Secondary | ICD-10-CM | POA: Diagnosis present

## 2020-05-08 DIAGNOSIS — Z0000 Encounter for general adult medical examination without abnormal findings: Secondary | ICD-10-CM | POA: Diagnosis not present

## 2020-05-08 DIAGNOSIS — R87610 Atypical squamous cells of undetermined significance on cytologic smear of cervix (ASC-US): Secondary | ICD-10-CM | POA: Insufficient documentation

## 2020-05-08 NOTE — Progress Notes (Signed)
GYNECOLOGY ANNUAL PREVENTATIVE CARE ENCOUNTER NOTE  History:     Lauren Adams is a 19 y.o. No obstetric history on file. female here for a routine annual gynecologic exam.  Current complaints: None. Denies abnormal vaginal bleeding, discharge, pelvic pain, problems with intercourse or other gynecologic concerns. Partners in the last 6 months: 3   Sexual orientation: Straight.   Gynecologic History Patient's last menstrual period was 05/03/2020 (exact date). Contraception: None- declines  Last Pap: NA   Obstetric History OB History  No obstetric history on file.    Past Medical History:  Diagnosis Date  . Eczema   . Medical history non-contributory   . Seizures (HCC)     Past Surgical History:  Procedure Laterality Date  . no surgical history      Current Outpatient Medications on File Prior to Visit  Medication Sig Dispense Refill  . acetaminophen (TYLENOL) 500 MG tablet Take 1,500 mg by mouth every 8 (eight) hours as needed for moderate pain.    Marland Kitchen ibuprofen (ADVIL,MOTRIN) 200 MG tablet Take 400-600 mg by mouth every 6 (six) hours as needed for moderate pain.     . fluticasone (FLONASE) 50 MCG/ACT nasal spray 2 sprays per nostril daily as needed (Patient taking differently: Place 2 sprays into both nostrils daily as needed for allergies. 2 sprays per nostril daily as needed) 18.2 g 5  . levocetirizine (XYZAL) 5 MG tablet Take 1 tablet (5 mg total) by mouth every evening. (Patient taking differently: Take 5 mg by mouth daily as needed for allergies. ) 30 tablet 5  . Olopatadine HCl (PAZEO) 0.7 % SOLN Place 1 drop into both eyes daily as needed. (Patient not taking: Reported on 05/08/2020) 1 Bottle 5   No current facility-administered medications on file prior to visit.    No Known Allergies  Social History:  reports that she has never smoked. She has never used smokeless tobacco. She reports previous drug use. Drug: Marijuana. She reports that she does not drink  alcohol.  Family History  Problem Relation Age of Onset  . Carpal tunnel syndrome Mother   . Hypertension Maternal Aunt   . Kidney disease Maternal Aunt   . Diabetes Maternal Aunt   . Diabetes Maternal Grandmother   . Hypertension Maternal Grandmother   . Kidney disease Maternal Grandmother   . Asthma Other   . Eczema Other   . Angioedema Neg Hx   . Immunodeficiency Neg Hx   . Urticaria Neg Hx     The following portions of the patient's history were reviewed and updated as appropriate: allergies, current medications, past family history, past medical history, past social history, past surgical history and problem list.  Review of Systems Pertinent items noted in HPI and remainder of comprehensive ROS otherwise negative.  Physical Exam:  BP 103/67   Pulse 71   Ht 5\' 5"  (1.651 m)   Wt 169 lb 12.8 oz (77 kg)   LMP 05/03/2020 (Exact Date)   BMI 28.26 kg/m  CONSTITUTIONAL: Well-developed, well-nourished female in no acute distress.  HENT:  Normocephalic, atraumatic, External right and left ear normal. Oropharynx is clear and moist EYES: Conjunctivae and EOM are normal. Pupils are equal, round, and reactive to light. No scleral icterus.  NECK: Normal range of motion, supple, no masses.  Normal thyroid.  SKIN: Skin is warm and dry. No rash noted. Not diaphoretic. No erythema. No pallor. MUSCULOSKELETAL: Normal range of motion. No tenderness.  No cyanosis, clubbing, or edema.  2+ distal pulses. NEUROLOGIC: Alert and oriented to person, place, and time. Normal reflexes, muscle tone coordination.  PSYCHIATRIC: Normal mood and affect. Normal behavior. Normal judgment and thought content. CARDIOVASCULAR: Normal heart rate noted, regular rhythm RESPIRATORY: Clear to auscultation bilaterally. Effort and breath sounds normal, no problems with respiration noted. BREASTS: Symmetric in size. No masses, tenderness, skin changes, nipple drainage, or lymphadenopathy bilaterally. Performed in the  presence of a chaperone. ABDOMEN: Soft, no distention noted.  No tenderness, rebound or guarding.  PELVIC: Normal appearing external genitalia and urethral meatus; normal appearing vaginal mucosa and cervix.  No abnormal discharge noted.  Pap smear obtained.  Normal uterine size, no other palpable masses, no uterine or adnexal tenderness.  Performed in the presence of a chaperone.   Assessment and Plan:  1. Women's annual routine gynecological examination  - Cytology - PAP( [place])  2. Screen for STD (sexually transmitted disease)  - HIV Antibody (routine testing w rflx) - RPR - Hepatitis B Surface AntiGEN - Hepatitis C Antibody  Will follow up results of pap smear and manage accordingly. Routine preventative health maintenance measures emphasized. Please refer to After Visit Summary for other counseling recommendations.     Quavon Keisling, Artist Pais, [place] for [employer], [place]

## 2020-05-09 LAB — HEPATITIS C ANTIBODY: Hep C Virus Ab: <0.1 s/co ratio (ref 0.0–0.9)

## 2020-05-09 LAB — HIV ANTIBODY (ROUTINE TESTING W REFLEX): HIV Screen 4th Generation wRfx: NONREACTIVE

## 2020-05-09 LAB — RPR: RPR Ser Ql: NONREACTIVE

## 2020-05-09 LAB — HEPATITIS B SURFACE ANTIGEN: Hepatitis B Surface Ag: NEGATIVE

## 2020-05-13 LAB — CYTOLOGY - PAP
Chlamydia: NEGATIVE
Comment: NEGATIVE
Comment: NEGATIVE
Comment: NEGATIVE
Comment: NORMAL
Diagnosis: UNDETERMINED — AB
High risk HPV: NEGATIVE
Neisseria Gonorrhea: NEGATIVE
Trichomonas: NEGATIVE

## 2020-06-05 DIAGNOSIS — Z419 Encounter for procedure for purposes other than remedying health state, unspecified: Secondary | ICD-10-CM | POA: Diagnosis not present

## 2020-07-06 DIAGNOSIS — Z419 Encounter for procedure for purposes other than remedying health state, unspecified: Secondary | ICD-10-CM | POA: Diagnosis not present

## 2020-07-10 ENCOUNTER — Encounter: Payer: Self-pay | Admitting: Obstetrics and Gynecology

## 2020-07-10 ENCOUNTER — Other Ambulatory Visit: Payer: Self-pay

## 2020-07-10 ENCOUNTER — Ambulatory Visit (INDEPENDENT_AMBULATORY_CARE_PROVIDER_SITE_OTHER): Payer: Medicaid Other | Admitting: Obstetrics and Gynecology

## 2020-07-10 VITALS — BP 112/66 | HR 61

## 2020-07-10 DIAGNOSIS — N926 Irregular menstruation, unspecified: Secondary | ICD-10-CM | POA: Diagnosis not present

## 2020-07-10 DIAGNOSIS — Z3202 Encounter for pregnancy test, result negative: Secondary | ICD-10-CM | POA: Diagnosis not present

## 2020-07-10 DIAGNOSIS — Z789 Other specified health status: Secondary | ICD-10-CM

## 2020-07-10 HISTORY — DX: Other specified health status: Z78.9

## 2020-07-10 LAB — POCT PREGNANCY, URINE: Preg Test, Ur: NEGATIVE

## 2020-07-10 NOTE — Progress Notes (Signed)
  Obstetrics and Gynecology Visit Return Patient Evaluation  Appointment Date: 07/10/2020  Primary Care Provider: Patient, No Pcp Per  OBGYN Clinic: [facility] for [facility]  Chief Complaint: late period  History of Present Illness:  Lauren Adams is a 19 y.o. G0 with above CC. H/o seizure d/o and inability to take OCPs due to "liver issue" per patient report.   Patient had a normal 6/28 to 7/2 period. She has periods qmonth that aren't heavy or painful. About a week later for a few days she had some cramping and nausea.  Period supposed be on 6/29 and only had spotting and none until today and have some pink d/c. 6/30 UPT neg. Period usually q35-38days  Review of Systems:  as noted in the History of Present Illness.  Patient Active Problem List   Diagnosis Date Noted  . Liver issues with birth control pills 07/10/2020  . Late period 07/10/2020  . 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 Allergic reaction 02/14/2019  . Allergic conjunctivitis 02/14/2019  . Seasonal and perennial allergic rhinitis 02/14/2019  . Conversion disorder with abnormal movement   . Adjustment disorder with other symptom   . VISUAL ACUITY, DECREASED 09/09/2010  . CONSTIPATION, INTERMITTENT 09/09/2010   Medications:  Lauren Adams had no medications administered during this visit. Current Outpatient Medications  Medication Sig Dispense Refill  . acetaminophen (TYLENOL) 500 MG tablet Take 1,500 mg by mouth every 8 (eight) hours as needed for moderate pain.    . fluticasone (FLONASE) 50 MCG/ACT nasal spray 2 sprays per nostril daily as needed (Patient taking differently: Place 2 sprays into both nostrils daily as needed for allergies. 2 sprays per nostril daily as needed) 18.2 g 5  . ibuprofen (ADVIL,MOTRIN) 200 MG tablet Take 400-600 mg by mouth every 6 (six) hours as needed for moderate pain.     Marland Kitchen levocetirizine (XYZAL) 5 MG tablet Take 1 tablet (5 mg total) by mouth every evening. (Patient  taking differently: Take 5 mg by mouth daily as needed for allergies. ) 30 tablet 5  . Olopatadine HCl (PAZEO) 0.7 % SOLN Place 1 drop into both eyes daily as needed. (Patient not taking: Reported on 05/08/2020) 1 Bottle 5   No current facility-administered medications for this visit.    Allergies: has No Known Allergies.  Physical Exam:  BP 112/66   Pulse 61   LMP 07/02/2020 (Exact Date)  There is no height or weight on file to calculate BMI. General appearance: Well nourished, well developed female in no acute distress.  Neuro/Psych:  Normal mood and affect.     Labs: UPT negative  Assessment: pt stable  Plan:  1. Late period D/w her technically normal at 21-42 day interval between cycles. Follow up lab hcg. BC options d/w her and declines anything for now.  - Beta hCG quant (ref lab)  RTC: PRN  Cornelia Copa MD Attending Center for [facility])

## 2020-07-11 LAB — BETA HCG QUANT (REF LAB): hCG Quant: <1 m[IU]/mL

## 2020-08-06 DIAGNOSIS — Z419 Encounter for procedure for purposes other than remedying health state, unspecified: Secondary | ICD-10-CM | POA: Diagnosis not present

## 2020-09-05 DIAGNOSIS — Z419 Encounter for procedure for purposes other than remedying health state, unspecified: Secondary | ICD-10-CM | POA: Diagnosis not present

## 2020-10-06 DIAGNOSIS — Z419 Encounter for procedure for purposes other than remedying health state, unspecified: Secondary | ICD-10-CM | POA: Diagnosis not present

## 2020-11-05 DIAGNOSIS — Z419 Encounter for procedure for purposes other than remedying health state, unspecified: Secondary | ICD-10-CM | POA: Diagnosis not present

## 2020-12-06 DIAGNOSIS — Z419 Encounter for procedure for purposes other than remedying health state, unspecified: Secondary | ICD-10-CM | POA: Diagnosis not present

## 2020-12-06 DIAGNOSIS — F32A Depression, unspecified: Secondary | ICD-10-CM

## 2020-12-06 HISTORY — DX: Depression, unspecified: F32.A

## 2020-12-18 DIAGNOSIS — R109 Unspecified abdominal pain: Secondary | ICD-10-CM | POA: Diagnosis not present

## 2020-12-18 DIAGNOSIS — R4589 Other symptoms and signs involving emotional state: Secondary | ICD-10-CM | POA: Diagnosis not present

## 2021-01-06 DIAGNOSIS — F325 Major depressive disorder, single episode, in full remission: Secondary | ICD-10-CM | POA: Insufficient documentation

## 2021-01-06 DIAGNOSIS — R109 Unspecified abdominal pain: Secondary | ICD-10-CM | POA: Diagnosis not present

## 2021-01-06 DIAGNOSIS — F419 Anxiety disorder, unspecified: Secondary | ICD-10-CM | POA: Diagnosis not present

## 2021-01-06 DIAGNOSIS — R4589 Other symptoms and signs involving emotional state: Secondary | ICD-10-CM | POA: Diagnosis not present

## 2021-01-06 DIAGNOSIS — F4329 Adjustment disorder with other symptoms: Secondary | ICD-10-CM | POA: Diagnosis not present

## 2021-01-06 DIAGNOSIS — Z419 Encounter for procedure for purposes other than remedying health state, unspecified: Secondary | ICD-10-CM | POA: Diagnosis not present

## 2021-01-09 DIAGNOSIS — R109 Unspecified abdominal pain: Secondary | ICD-10-CM | POA: Diagnosis not present

## 2021-02-03 DIAGNOSIS — Z419 Encounter for procedure for purposes other than remedying health state, unspecified: Secondary | ICD-10-CM | POA: Diagnosis not present

## 2021-02-05 IMAGING — CT CT HEAD W/O CM
4 series · 15 of 47 positions shown, 17 images · non-contrast
Comparison: [id number]

CLINICAL DATA: Dizziness

EXAM:
CT HEAD WITHOUT CONTRAST
TECHNIQUE: Contiguous axial images were obtained from the base of the skull
through the vertex without intravenous contrast.

[Series 3: head without · axial · non-contrast · 0.40mm/px · z∈[+41,+171]mm · 7 of 36 slices shown, 9 images]
[im 5/36  brain]
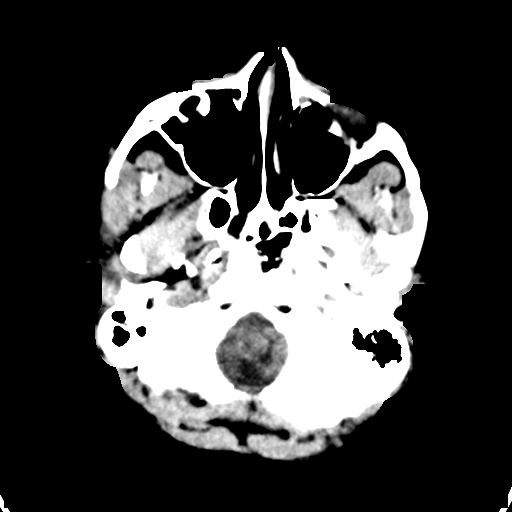
[im 5/36  bone]
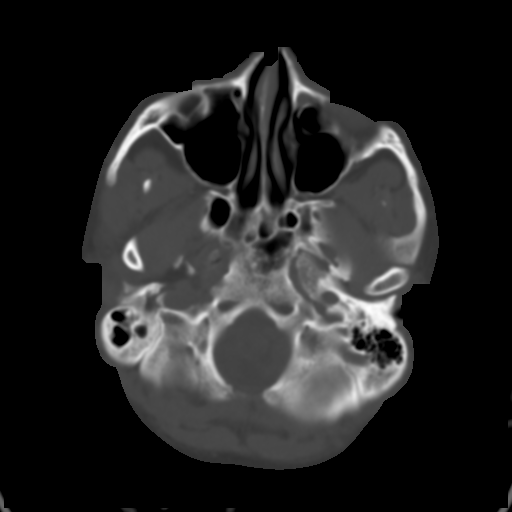
[im 9/36  brain]
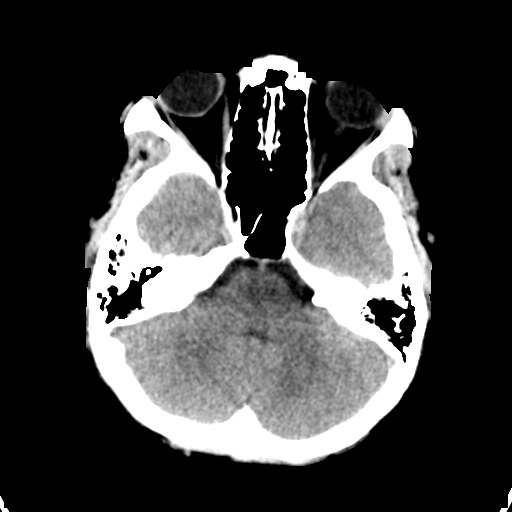
[im 14/36  brain]
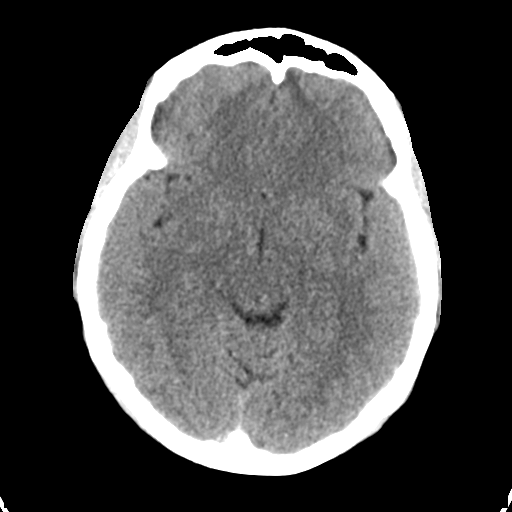
[im 18/36  brain]
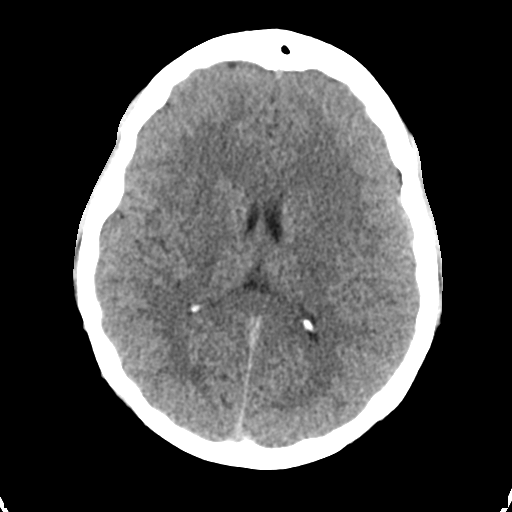
[im 22/36  brain]
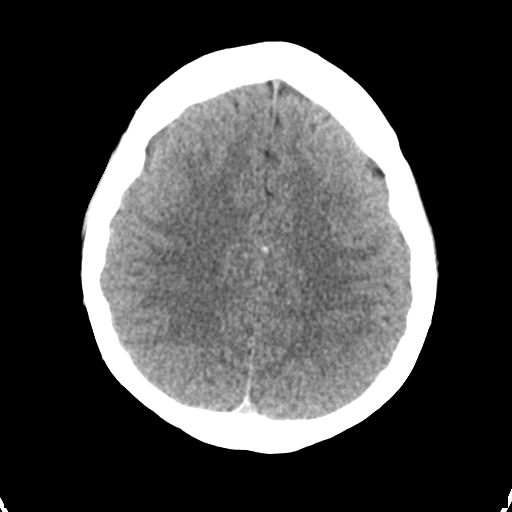
[im 22/36  bone]
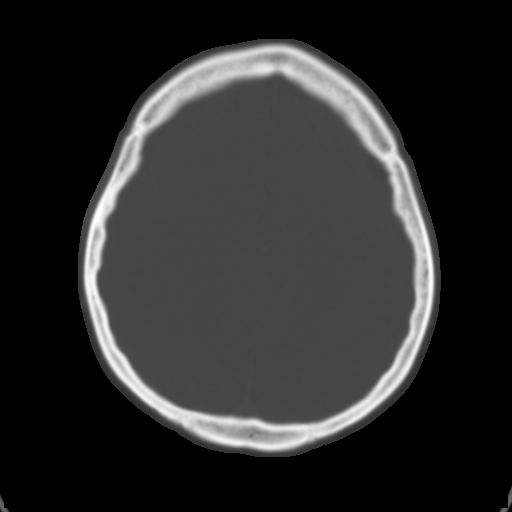
[im 27/36  brain]
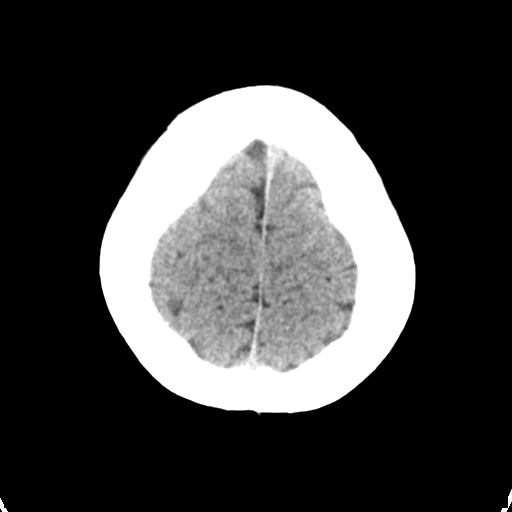
[im 31/36  brain]
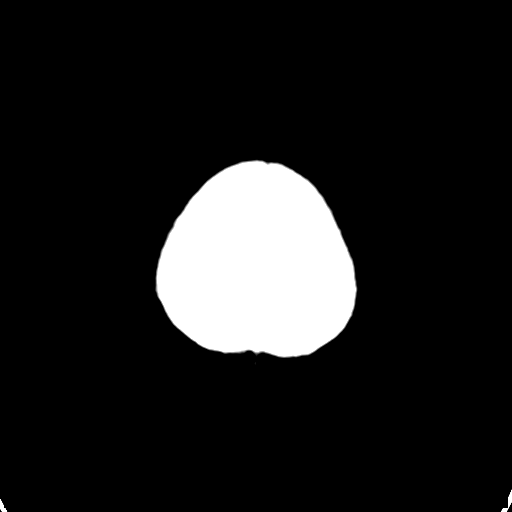

[Series 4: head bone · axial · 0.40mm/px · z∈[+37,+55]mm · 2 of 88 slices shown]
[im 9/88  bone]
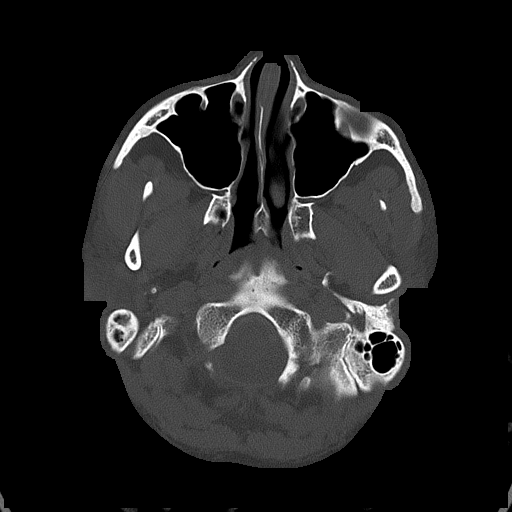
[im 18/88  bone]
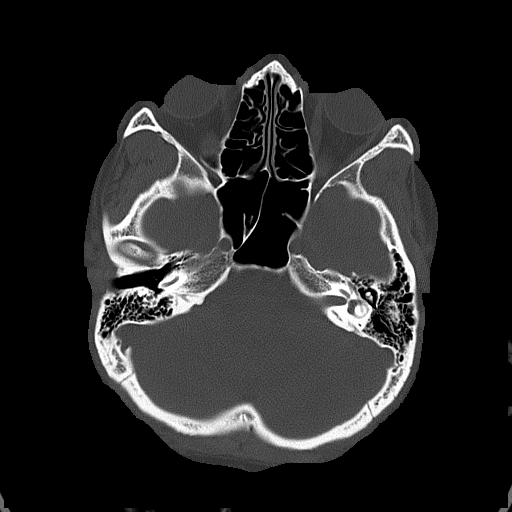

[Series 5: head without cor · coronal · non-contrast · 0.34mm/px · 3 of 67 slices shown]
[im 23/67  brain]
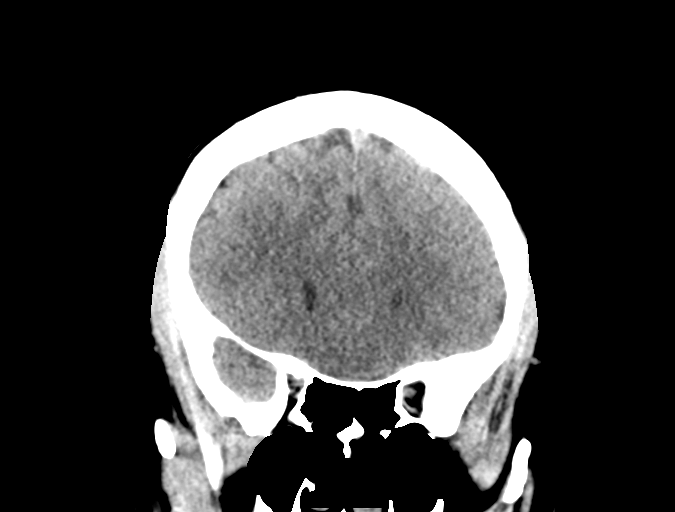
[im 30/67  brain]
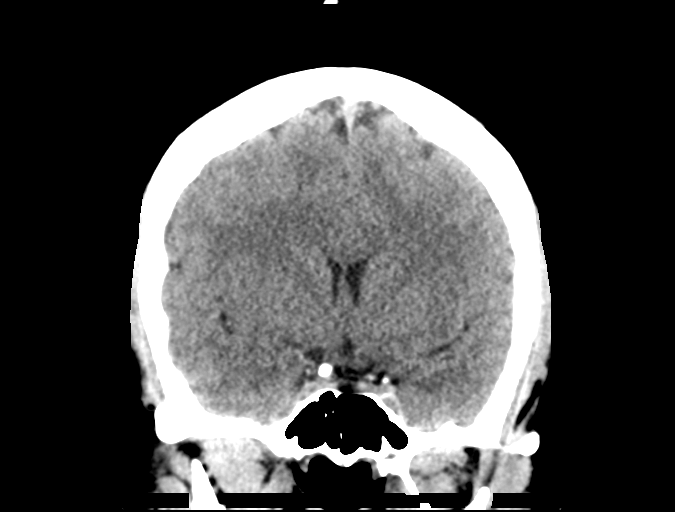
[im 37/67  brain]
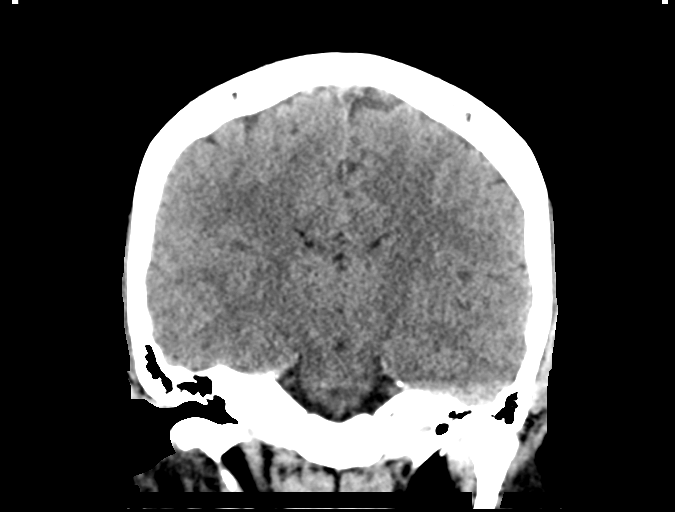

[Series 6: head without sag · sagittal · non-contrast · 0.34mm/px · 3 of 67 slices shown]
[im 23/67  brain]
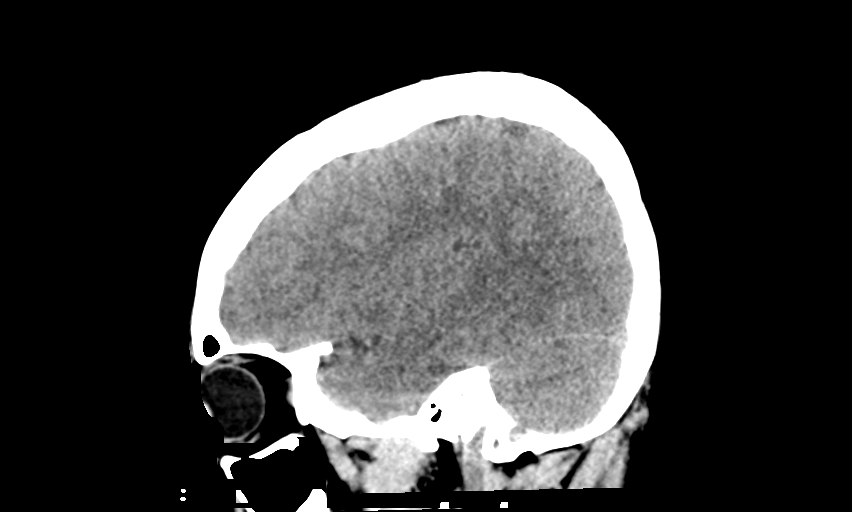
[im 34/67  brain]
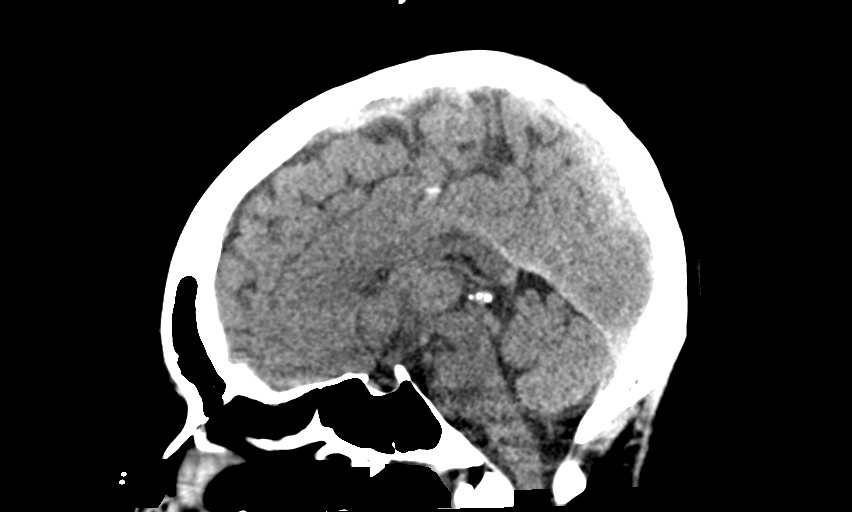
[im 45/67  brain]
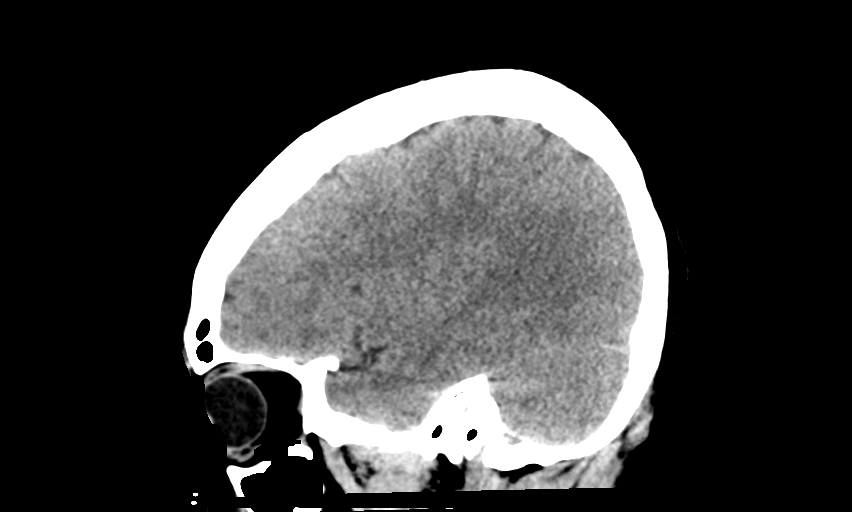

[15 of 47 positions shown; findings below may reference images not displayed]

FINDINGS: Brain: There is no acute intracranial hemorrhage, mass-effect, or
edema. Gray-white differentiation is preserved. There is no
extra-axial fluid collection. Ventricles and sulci are within normal
limits in size and configuration.

Vascular: No hyperdense vessel or unexpected calcification.

Skull: Calvarium is unremarkable.

Sinuses/Orbits: No acute finding.

Other: None.
IMPRESSION: No acute intracranial abnormality.

## 2021-02-06 DIAGNOSIS — R4589 Other symptoms and signs involving emotional state: Secondary | ICD-10-CM | POA: Diagnosis not present

## 2021-02-06 DIAGNOSIS — R109 Unspecified abdominal pain: Secondary | ICD-10-CM | POA: Diagnosis not present

## 2021-02-06 DIAGNOSIS — F419 Anxiety disorder, unspecified: Secondary | ICD-10-CM | POA: Diagnosis not present

## 2021-02-06 DIAGNOSIS — N898 Other specified noninflammatory disorders of vagina: Secondary | ICD-10-CM | POA: Diagnosis not present

## 2021-02-06 DIAGNOSIS — F4329 Adjustment disorder with other symptoms: Secondary | ICD-10-CM | POA: Diagnosis not present

## 2021-02-13 DIAGNOSIS — K5901 Slow transit constipation: Secondary | ICD-10-CM | POA: Diagnosis not present

## 2021-02-13 DIAGNOSIS — R1030 Lower abdominal pain, unspecified: Secondary | ICD-10-CM | POA: Diagnosis not present

## 2021-03-06 DIAGNOSIS — Z419 Encounter for procedure for purposes other than remedying health state, unspecified: Secondary | ICD-10-CM | POA: Diagnosis not present

## 2021-03-13 DIAGNOSIS — F4329 Adjustment disorder with other symptoms: Secondary | ICD-10-CM | POA: Diagnosis not present

## 2021-03-13 DIAGNOSIS — R4589 Other symptoms and signs involving emotional state: Secondary | ICD-10-CM | POA: Diagnosis not present

## 2021-04-05 DIAGNOSIS — Z419 Encounter for procedure for purposes other than remedying health state, unspecified: Secondary | ICD-10-CM | POA: Diagnosis not present

## 2021-04-27 ENCOUNTER — Emergency Department (HOSPITAL_COMMUNITY)
Admission: EM | Admit: 2021-04-27 | Discharge: 2021-04-27 | Disposition: A | Discharge status: Home / Self Care | Payer: Medicaid Other | Attending: Emergency Medicine | Admitting: Emergency Medicine

## 2021-04-27 ENCOUNTER — Encounter (HOSPITAL_COMMUNITY): Payer: Self-pay

## 2021-04-27 ENCOUNTER — Other Ambulatory Visit: Payer: Self-pay

## 2021-04-27 DIAGNOSIS — J302 Other seasonal allergic rhinitis: Secondary | ICD-10-CM | POA: Diagnosis not present

## 2021-04-27 DIAGNOSIS — R0981 Nasal congestion: Secondary | ICD-10-CM | POA: Diagnosis present

## 2021-04-27 LAB — PREGNANCY, URINE: Preg Test, Ur: NEGATIVE

## 2021-04-27 NOTE — Discharge Instructions (Signed)
Continue taking your over-the-counter allergy medications. Use saline nasal spray. You can use Mucinex if you have thick mucus production. Drink lots of water. Follow with your primary care.

## 2021-04-27 NOTE — ED Provider Notes (Signed)
Emergency Medicine Provider Triage Evaluation Note  Lauren Adams 20 y.o. female was evaluated in triage.  Pt complains of concerns for possible allergic reaction.  Patient reports she is allergic to pollen, trees.  She states when she woke up from a nap this morning, she fell like her face was puffy.  She also felt like she has phlegm in her throat that is causing her throat to be irritated and causing her to cough.  She denies any chest pain, difficulty breathing.  No rash.  No swelling of tongue or lips.  She took Claritin with minimal improvement.  No new medications, lotions, soaps, detergents.  No other allergies.   Review of Systems  Positive: Throat irritation Negative: Rash, swelling of tongue or lips.  Physical Exam  BP 134/82   Pulse 70   Temp 98.2 F (36.8 C) (Oral)   Resp 18   Ht 5\' 4"  (1.626 m)   Wt 65.8 kg   SpO2 100%   BMI 24.89 kg/m  Gen:   Awake, no distress  HEENT:  Atraumatic.  Posterior oropharynx is clear without any signs of erythema, edema.  No exudates.  Uvula is midline.  Airways patent phonation is intact. Resp:  Normal effort.  No evidence of respiratory distress.  Able to speak in full sentences without any difficulty.  No wheezing. Cardiac:  Normal rate  Abd:   Nondistended, nontender  MSK:   Moves extremities without difficulty  Neuro:  Speech clear   Other:   No rash noted.  Medical Decision Making  Medically screening exam initiated at 7:48 PM  Appropriate orders placed.  was informed that the remainder of the evaluation will be completed by another provider, this initial triage assessment does not replace that evaluation. They are counseled that they will need to remain in the ED until the completion of their workup, including full H&P and results of any tests.  Risks of leaving the emergency department prior to completion of treatment were discussed. Patient was advised to inform ED staff if they are leaving before their  treatment is complete. The patient acknowledged these risks and time was allowed for questions.     The patient appears stable so that the remainder of the MSE may be completed by another provider.   Clinical Impression  Allergies    Portions of this note were generated with Dragon dictation software. Dictation errors may occur despite best attempts at proofreading.      Boykin Peek, PA-C 04/27/21 1949    04/29/21, MD 04/28/21 [id number]

## 2021-04-27 NOTE — ED Triage Notes (Signed)
Pt c/o allergy symptoms starting yesterday. Reports congestion, HA and throat discomfort. States she took allergy medicine at home with some improvement

## 2021-04-27 NOTE — ED Provider Notes (Signed)
[facility]-EMERGENCY DEPT Provider Note   CSN: [id number] Arrival date & time: 04/27/21  1935     History Chief Complaint  Patient presents with  . Nasal Congestion    Lauren Adams is a 20 y.o. female with past medical history of seasonal allergies.  She is presenting to the emergency department for evaluation of her allergy symptoms.  She states her symptoms are typical of her seasonal allergy symptoms.  She has nasal congestion with mucus production.  She states she woke up from a nap and had a lot of phlegm in her throat which she has been having trouble clearing.  At that time her face was puffy.  Denies itchy or watery eyes, denies sneezing, fevers, cough, shortness of breath, wheezing.  No swelling of lips or tongue, no rash.  Took Claritin.  The history is provided by the patient.       Past Medical History:  Diagnosis Date  . Eczema   . Liver issues with birth control pills 07/10/2020  . Seizures [facility])     Patient Active Problem List   Diagnosis Date Noted  . Liver issues with birth control pills 07/10/2020  . Late period 07/10/2020  . 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 Allergic reaction 02/14/2019  . Allergic conjunctivitis 02/14/2019  . Seasonal and perennial allergic rhinitis 02/14/2019  . Conversion disorder with abnormal movement   . Adjustment disorder with other symptom   . VISUAL ACUITY, DECREASED 09/09/2010  . CONSTIPATION, INTERMITTENT 09/09/2010    Past Surgical History:  Procedure Laterality Date  . no surgical history       OB History    Gravida  0   Para  0   Term  0   Preterm  0   AB  0   Living  0     SAB  0   IAB  0   Ectopic  0   Multiple  0   Live Births  0           Family History  Problem Relation Age of Onset  . Carpal tunnel syndrome Mother   . Hypertension Maternal Aunt   . Kidney disease Maternal Aunt   . Diabetes Maternal Aunt   . Diabetes Maternal Grandmother   . Hypertension Maternal  Grandmother   . Kidney disease Maternal Grandmother   . Asthma Other   . Eczema Other   . Angioedema Neg Hx   . Immunodeficiency Neg Hx   . Urticaria Neg Hx     Social History   Tobacco Use  . Smoking status: Never Smoker  . Smokeless tobacco: Never Used  Vaping Use  . Vaping Use: Former  Substance Use Topics  . Alcohol use: Never    Alcohol/week: 0.0 standard drinks  . Drug use: Not Currently    Types: Marijuana    Home Medications Prior to Admission medications   Medication Sig Start Date End Date Taking? Authorizing Provider  acetaminophen (TYLENOL) 500 MG tablet Take 1,500 mg by mouth every 8 (eight) hours as needed for moderate pain.    [provider]  fluticasone (FLONASE) 50 MCG/ACT nasal spray 2 sprays per nostril daily as needed Patient taking differently: Place 2 sprays into both nostrils daily as needed for allergies. 2 sprays per nostril daily as needed 02/14/19   Bobbitt, Heywood Iles, MD  ibuprofen (ADVIL,MOTRIN) 200 MG tablet Take 400-600 mg by mouth every 6 (six) hours as needed for moderate pain.     [provider]  levocetirizine (XYZAL) 5 MG tablet Take 1 tablet (5 mg total) by mouth every evening. Patient taking differently: Take 5 mg by mouth daily as needed for allergies.  02/14/19   Bobbitt, Heywood Iles, MD  Olopatadine HCl (PAZEO) 0.7 % SOLN Place 1 drop into both eyes daily as needed. Patient not taking: Reported on 05/08/2020 02/14/19   Bobbitt, Heywood Iles, MD    Allergies    Drug ingredient [bee pollen]  Review of Systems   Review of Systems  All other systems reviewed and are negative.   Physical Exam Updated Vital Signs BP 117/74 (BP Location: Right Arm)   Pulse 91   Temp 98.4 F (36.9 C) (Oral)   Resp 16   Ht 5\' 5"  (1.651 m)   Wt 73.5 kg   LMP 04/02/2021 (Within Days)   SpO2 95%   BMI 26.96 kg/m   Physical Exam Vitals and nursing note reviewed.  Constitutional:      Appearance: She is well-developed.  HENT:      Head: Normocephalic and atraumatic.     Nose: Congestion present.     Right Turbinates: Swollen.     Left Turbinates: Swollen.     Mouth/Throat:     Mouth: Mucous membranes are moist.     Pharynx: Oropharynx is clear. No oropharyngeal exudate or posterior oropharyngeal erythema.     Comments: Posterior oropharynx is normal in appearance, no edema or exudates.  Uvula is midline, tolerating secretions Eyes:     Conjunctiva/sclera: Conjunctivae normal.  Cardiovascular:     Rate and Rhythm: Normal rate and regular rhythm.  Pulmonary:     Effort: Pulmonary effort is normal. No respiratory distress.     Breath sounds: Normal breath sounds. No stridor.  Neurological:     Mental Status: She is alert.  Psychiatric:        Mood and Affect: Mood normal.        Behavior: Behavior normal.     ED Results / Procedures / Treatments   Labs (all labs ordered are listed, but only abnormal results are displayed) Labs Reviewed  PREGNANCY, URINE    EKG None  Radiology No results found.  Procedures Procedures   Medications Ordered in ED Medications - No data to display  ED Course  I have reviewed the triage vital signs and the nursing notes.  Pertinent labs & imaging results that were available during my care of the patient were reviewed by me and considered in my medical decision making (see chart for details).    MDM Rules/Calculators/A&P                          .  Patient here for evaluation of her seasonal allergies.  She is well-appearing and in no distress.  Airway is patent, lungs are clear.  Afebrile.  Recommend continue antihistamines, add on nasal sprays, Mucinex as needed for thick secretions.  Good oral hydration.  PCP follow-up if symptoms persist.  Discharged in no distress  Discussed results, findings, treatment and follow up. Patient advised of return precautions. Patient verbalized understanding and agreed with plan.  Final Clinical Impression(s) / ED  Diagnoses Final diagnoses:  Seasonal allergies    Rx / DC Orders ED Discharge Orders    None       Rowland Ericsson, 04/04/2021 N, PA-C 04/27/21 2314    04/29/21, MD 04/28/21 1344

## 2021-05-06 DIAGNOSIS — Z419 Encounter for procedure for purposes other than remedying health state, unspecified: Secondary | ICD-10-CM | POA: Diagnosis not present

## 2021-05-15 ENCOUNTER — Encounter: Payer: Self-pay | Admitting: Emergency Medicine

## 2021-05-15 ENCOUNTER — Ambulatory Visit
Admission: EM | Admit: 2021-05-15 | Discharge: 2021-05-15 | Disposition: A | Discharge status: Home / Self Care | Payer: Medicaid Other | Attending: Emergency Medicine | Admitting: Emergency Medicine

## 2021-05-15 ENCOUNTER — Other Ambulatory Visit: Payer: Self-pay

## 2021-05-15 DIAGNOSIS — Z113 Encounter for screening for infections with a predominantly sexual mode of transmission: Secondary | ICD-10-CM | POA: Diagnosis not present

## 2021-05-15 LAB — POCT URINE PREGNANCY: Preg Test, Ur: NEGATIVE

## 2021-05-15 NOTE — Discharge Instructions (Addendum)
We are testing you for Gonorrhea, Chlamydia, Trichomonas, Yeast and Bacterial Vaginosis. We will call you if anything is positive and let you know if you require any further treatment. Please inform partners of any positive results.   Please return if symptoms not improving with treatment, development of fever, nausea, vomiting, abdominal pain.  

## 2021-05-15 NOTE — ED Triage Notes (Signed)
Patient c/o vaginal discomfort x 1 day.   Patient endorses an encounter of unprotected sexual activity on Sunday. Patient endorses taking a plan B pill on Monday.   Patient denies any vaginal discharge or odor.   Patient denies ABD pain, pelvic pain, or dysuria.   Patient hasn't taken any medications for symptoms.

## 2021-05-15 NOTE — ED Provider Notes (Signed)
[facility]    CSN: [id number] Arrival date & time: 05/15/21  1538      History   Chief Complaint Chief Complaint  Patient presents with   Vaginal Discomfort    HPI Lauren Adams is a 20 y.o. female presenting today for STD screening.  Patient reports having unprotected intercourse on Sunday, took Plan B the following day.  Reports that her last menstrual cycle was 5/25 and felt as if she was during her ovulation week and was concerned about fertility.  She denies any abdominal pain or bleeding since.  Denies any vaginal discharge or odor.  Denies irritation or odor, only vaginal "discomfort".  Would like to be screened for STDs including HIV and syphilis.  HPI  Past Medical History:  Diagnosis Date   Depression 2022   Eczema    Liver issues with birth control pills 07/10/2020   Seizures [facility])     Patient Active Problem List   Diagnosis Date Noted   Liver issues with birth control pills 07/10/2020   Late period 07/10/2020   Angioedema 02/14/2019   Allergic reaction 02/14/2019   Allergic conjunctivitis 02/14/2019   Seasonal and perennial allergic rhinitis 02/14/2019   Conversion disorder with abnormal movement    Adjustment disorder with other symptom    VISUAL ACUITY, DECREASED 09/09/2010   CONSTIPATION, INTERMITTENT 09/09/2010    Past Surgical History:  Procedure Laterality Date   no surgical history      OB History     Gravida  0   Para  0   Term  0   Preterm  0   AB  0   Living  0      SAB  0   IAB  0   Ectopic  0   Multiple  0   Live Births  0            Home Medications    Prior to Admission medications   Medication Sig Start Date End Date Taking? Authorizing Provider  escitalopram (LEXAPRO) 10 MG tablet Take 1 tablet by mouth daily. 01/23/21  Yes [provider]  acetaminophen (TYLENOL) 500 MG tablet Take 1,500 mg by mouth every 8 (eight) hours as needed for moderate pain.    [provider]   fluticasone (FLONASE) 50 MCG/ACT nasal spray 2 sprays per nostril daily as needed Patient taking differently: Place 2 sprays into both nostrils daily as needed for allergies. 2 sprays per nostril daily as needed 02/14/19   Bobbitt, Heywood Iles, MD  ibuprofen (ADVIL,MOTRIN) 200 MG tablet Take 400-600 mg by mouth every 6 (six) hours as needed for moderate pain.     [provider]  levocetirizine (XYZAL) 5 MG tablet Take 1 tablet (5 mg total) by mouth every evening. Patient taking differently: Take 5 mg by mouth daily as needed for allergies. 02/14/19   Bobbitt, Heywood Iles, MD  Olopatadine HCl (PAZEO) 0.7 % SOLN Place 1 drop into both eyes daily as needed. Patient not taking: No sig reported 02/14/19   Bobbitt, Heywood Iles, MD    Family History Family History  Problem Relation Age of Onset   Carpal tunnel syndrome Mother    Hypertension Maternal Aunt    Kidney disease Maternal Aunt    Diabetes Maternal Aunt    Diabetes Maternal Grandmother    Hypertension Maternal Grandmother    Kidney disease Maternal Grandmother    Asthma Other    Eczema Other    Angioedema Neg Hx  Immunodeficiency Neg Hx    Urticaria Neg Hx     Social History Social History   Tobacco Use   Smoking status: Never   Smokeless tobacco: Never  Vaping Use   Vaping Use: Former  Substance Use Topics   Alcohol use: Never    Alcohol/week: 0.0 standard drinks   Drug use: Not Currently    Types: Marijuana     Allergies   Drug ingredient [bee pollen]   Review of Systems Review of Systems  Constitutional:  Negative for fever.  Respiratory:  Negative for shortness of breath.   Cardiovascular:  Negative for chest pain.  Gastrointestinal:  Negative for abdominal pain, diarrhea, nausea and vomiting.  Genitourinary:  Negative for dysuria, flank pain, genital sores, hematuria, menstrual problem, vaginal bleeding, vaginal discharge and vaginal pain.  Musculoskeletal:  Negative for back pain.  Skin:   Negative for rash.  Neurological:  Negative for dizziness, light-headedness and headaches.    Physical Exam Triage Vital Signs ED Triage Vitals  Enc Vitals Group     BP      Pulse      Resp      Temp      Temp src      SpO2      Weight      Height      Head Circumference      Peak Flow      Pain Score      Pain Loc      Pain Edu?      Excl. in GC?    No data found.  Updated Vital Signs BP 104/67 (BP Location: Left Arm)   Pulse 96   Temp 99.1 F (37.3 C) (Oral)   Resp 15   LMP 04/29/2021 (Exact Date)   SpO2 97%   Visual Acuity Right Eye Distance:   Left Eye Distance:   Bilateral Distance:    Right Eye Near:   Left Eye Near:    Bilateral Near:     Physical Exam Vitals and nursing note reviewed.  Constitutional:      Appearance: She is well-developed.     Comments: No acute distress  HENT:     Head: Normocephalic and atraumatic.     Nose: Nose normal.  Eyes:     Conjunctiva/sclera: Conjunctivae normal.  Cardiovascular:     Rate and Rhythm: Normal rate.  Pulmonary:     Effort: Pulmonary effort is normal. No respiratory distress.  Abdominal:     General: There is no distension.  Musculoskeletal:        General: Normal range of motion.     Cervical back: Neck supple.  Skin:    General: Skin is warm and dry.  Neurological:     Mental Status: She is alert and oriented to person, place, and time.     UC Treatments / Results  Labs (all labs ordered are listed, but only abnormal results are displayed) Labs Reviewed  HIV ANTIBODY (ROUTINE TESTING W REFLEX)  RPR  POCT URINE PREGNANCY  CERVICOVAGINAL ANCILLARY ONLY    EKG   Radiology No results found.  Procedures Procedures (including critical care time)  Medications Ordered in UC Medications - No data to display  Initial Impression / Assessment and Plan / UC Course  I have reviewed the triage vital signs and the nursing notes.  Pertinent labs & imaging results that were available during  my care of the patient were reviewed by me and considered in my medical decision  making (see chart for details).    STD screening-pregnancy test negative, vaginal swab pending, blood work pending for HIV and syphilis.  We will call with results and provide treatment based off results.  Discussed strict return precautions. Patient verbalized understanding and is agreeable with plan.  Final Clinical Impressions(s) / UC Diagnoses   Final diagnoses:  Screen for STD (sexually transmitted disease)     Discharge Instructions      We are testing you for Gonorrhea, Chlamydia, Trichomonas, Yeast and Bacterial Vaginosis. We will call you if anything is positive and let you know if you require any further treatment. Please inform partners of any positive results.   Please return if symptoms not improving with treatment, development of fever, nausea, vomiting, abdominal pain.      ED Prescriptions   None    PDMP not reviewed this encounter.   Lew Dawes, [place] 05/15/21 [phone]

## 2021-05-16 LAB — HIV ANTIBODY (ROUTINE TESTING W REFLEX): HIV Screen 4th Generation wRfx: NONREACTIVE

## 2021-05-16 LAB — RPR: RPR Ser Ql: NONREACTIVE

## 2021-05-18 ENCOUNTER — Telehealth (HOSPITAL_COMMUNITY): Payer: Self-pay | Admitting: Emergency Medicine

## 2021-05-18 LAB — CERVICOVAGINAL ANCILLARY ONLY
Bacterial Vaginitis (gardnerella): POSITIVE — AB
Candida Glabrata: NEGATIVE
Candida Vaginitis: POSITIVE — AB
Chlamydia: NEGATIVE
Comment: NEGATIVE
Comment: NEGATIVE
Comment: NEGATIVE
Comment: NEGATIVE
Comment: NEGATIVE
Comment: NORMAL
Neisseria Gonorrhea: NEGATIVE
Trichomonas: NEGATIVE

## 2021-05-18 MED ORDER — FLUCONAZOLE 150 MG PO TABS: 150.0000 mg | ORAL_TABLET | Freq: Once | ORAL | 0 refills | Status: AC

## 2021-05-18 MED ORDER — METRONIDAZOLE 500 MG PO TABS
500.0000 mg | ORAL_TABLET | Freq: Two times a day (BID) | ORAL | 0 refills | Status: DC
Start: 2021-05-18 — End: 2021-08-12

## 2021-05-19 ENCOUNTER — Ambulatory Visit: Payer: Medicaid Other

## 2021-05-22 DIAGNOSIS — R4589 Other symptoms and signs involving emotional state: Secondary | ICD-10-CM | POA: Diagnosis not present

## 2021-05-22 DIAGNOSIS — F4329 Adjustment disorder with other symptoms: Secondary | ICD-10-CM | POA: Diagnosis not present

## 2021-05-22 DIAGNOSIS — F419 Anxiety disorder, unspecified: Secondary | ICD-10-CM | POA: Diagnosis not present

## 2021-05-22 DIAGNOSIS — Z0000 Encounter for general adult medical examination without abnormal findings: Secondary | ICD-10-CM | POA: Diagnosis not present

## 2021-05-22 DIAGNOSIS — R87610 Atypical squamous cells of undetermined significance on cytologic smear of cervix (ASC-US): Secondary | ICD-10-CM | POA: Diagnosis not present

## 2021-05-22 DIAGNOSIS — F325 Major depressive disorder, single episode, in full remission: Secondary | ICD-10-CM | POA: Diagnosis not present

## 2021-05-22 DIAGNOSIS — Z23 Encounter for immunization: Secondary | ICD-10-CM | POA: Diagnosis not present

## 2021-05-22 DIAGNOSIS — Z124 Encounter for screening for malignant neoplasm of cervix: Secondary | ICD-10-CM | POA: Diagnosis not present

## 2021-06-05 DIAGNOSIS — Z419 Encounter for procedure for purposes other than remedying health state, unspecified: Secondary | ICD-10-CM | POA: Diagnosis not present

## 2021-06-30 DIAGNOSIS — Z20822 Contact with and (suspected) exposure to covid-19: Secondary | ICD-10-CM | POA: Diagnosis not present

## 2021-07-06 DIAGNOSIS — Z419 Encounter for procedure for purposes other than remedying health state, unspecified: Secondary | ICD-10-CM | POA: Diagnosis not present

## 2021-08-06 DIAGNOSIS — Z419 Encounter for procedure for purposes other than remedying health state, unspecified: Secondary | ICD-10-CM | POA: Diagnosis not present

## 2021-08-12 ENCOUNTER — Other Ambulatory Visit: Payer: Self-pay

## 2021-08-12 ENCOUNTER — Ambulatory Visit
Admission: EM | Admit: 2021-08-12 | Discharge: 2021-08-12 | Disposition: A | Discharge status: Home / Self Care | Payer: Medicaid Other | Attending: Urgent Care | Admitting: Urgent Care

## 2021-08-12 DIAGNOSIS — Z7251 High risk heterosexual behavior: Secondary | ICD-10-CM | POA: Insufficient documentation

## 2021-08-12 LAB — POCT URINE PREGNANCY: Preg Test, Ur: NEGATIVE

## 2021-08-12 NOTE — ED Triage Notes (Signed)
Pt requesting routine STD testing with blood work. Denies sx's. States has had un protective intercourse.

## 2021-08-12 NOTE — ED Provider Notes (Signed)
[facility]   MRN: [id number] DOB: 04/16/2001  Subjective:   Lauren Adams is a 20 y.o. female presenting for complete STI check.  Patient had unprotected sex in July and currently has 1 female partner, also does not use protection with them. Denies fever, n/v, abdominal pain, pelvic pain, rashes, dysuria, urinary frequency, hematuria, vaginal discharge.    No current facility-administered medications for this encounter.  Current Outpatient Medications:    acetaminophen (TYLENOL) 500 MG tablet, Take 1,500 mg by mouth every 8 (eight) hours as needed for moderate pain., Disp: , Rfl:    escitalopram (LEXAPRO) 10 MG tablet, Take 1 tablet by mouth daily., Disp: , Rfl:    fluticasone (FLONASE) 50 MCG/ACT nasal spray, 2 sprays per nostril daily as needed (Patient taking differently: Place 2 sprays into both nostrils daily as needed for allergies. 2 sprays per nostril daily as needed), Disp: 18.2 g, Rfl: 5   ibuprofen (ADVIL,MOTRIN) 200 MG tablet, Take 400-600 mg by mouth every 6 (six) hours as needed for moderate pain. , Disp: , Rfl:    levocetirizine (XYZAL) 5 MG tablet, Take 1 tablet (5 mg total) by mouth every evening. (Patient taking differently: No sig reported), Disp: 30 tablet, Rfl: 5   Olopatadine HCl (PAZEO) 0.7 % SOLN, Place 1 drop into both eyes daily as needed. (Patient not taking: No sig reported), Disp: 1 Bottle, Rfl: 5   Allergies  Allergen Reactions   Drug Ingredient [Bee Pollen]     Past Medical History:  Diagnosis Date   Depression 2022   Eczema    Liver issues with birth control pills 07/10/2020   Seizures (HCC)      Past Surgical History:  Procedure Laterality Date   no surgical history      Family History  Problem Relation Age of Onset   Carpal tunnel syndrome Mother    Hypertension Maternal Aunt    Kidney disease Maternal Aunt    Diabetes Maternal Aunt    Diabetes Maternal Grandmother    Hypertension Maternal Grandmother    Kidney disease  Maternal Grandmother    Asthma Other    Eczema Other    Angioedema Neg Hx    Immunodeficiency Neg Hx    Urticaria Neg Hx     Social History   Tobacco Use   Smoking status: Never   Smokeless tobacco: Never  Vaping Use   Vaping Use: Former  Substance Use Topics   Alcohol use: Never    Alcohol/week: 0.0 standard drinks   Drug use: Not Currently    Types: Marijuana    ROS   Objective:   Vitals: BP 106/67 (BP Location: Left Arm)   Pulse 87   Temp 99.5 F (37.5 C) (Oral)   Resp 18   LMP 07/19/2021   SpO2 98%   Physical Exam Constitutional:      General: She is not in acute distress.    Appearance: Normal appearance. She is well-developed. She is not ill-appearing, toxic-appearing or diaphoretic.  HENT:     Head: Normocephalic and atraumatic.     Nose: Nose normal.     Mouth/Throat:     Mouth: Mucous membranes are moist.     Pharynx: Oropharynx is clear.  Eyes:     General: No scleral icterus.       Right eye: No discharge.        Left eye: No discharge.     Extraocular Movements: Extraocular movements intact.     Conjunctiva/sclera: Conjunctivae normal.  Pupils: Pupils are equal, round, and reactive to light.  Cardiovascular:     Rate and Rhythm: Normal rate.  Pulmonary:     Effort: Pulmonary effort is normal.  Skin:    General: Skin is warm and dry.  Neurological:     General: No focal deficit present.     Mental Status: She is alert and oriented to person, place, and time.  Psychiatric:        Mood and Affect: Mood normal.        Behavior: Behavior normal.        Thought Content: Thought content normal.        Judgment: Judgment normal.    Results for orders placed or performed during the hospital encounter of 08/12/21 (from the past 24 hour(s))  POCT urine pregnancy     Status: None   Collection Time: 08/12/21 10:47 AM  Result Value Ref Range   Preg Test, Ur Negative Negative    Assessment and Plan :   PDMP not reviewed this  encounter.  1. Unprotected sex     Patient is asymptomatic, and is doing a standard check to make sure she does not have a sexually transmitted infection.  We will treat as appropriate based off of lab results.   Wallis Bamberg, PA-C 08/12/21 1101

## 2021-08-13 LAB — CERVICOVAGINAL ANCILLARY ONLY
Chlamydia: NEGATIVE
Comment: NEGATIVE
Comment: NEGATIVE
Comment: NORMAL
Neisseria Gonorrhea: NEGATIVE
Trichomonas: NEGATIVE

## 2021-08-13 LAB — HIV ANTIBODY (ROUTINE TESTING W REFLEX): HIV Screen 4th Generation wRfx: NONREACTIVE

## 2021-08-13 LAB — RPR: RPR Ser Ql: NONREACTIVE

## 2021-09-05 DIAGNOSIS — Z419 Encounter for procedure for purposes other than remedying health state, unspecified: Secondary | ICD-10-CM | POA: Diagnosis not present

## 2021-09-06 DIAGNOSIS — N898 Other specified noninflammatory disorders of vagina: Secondary | ICD-10-CM | POA: Diagnosis not present

## 2021-09-07 DIAGNOSIS — N760 Acute vaginitis: Secondary | ICD-10-CM | POA: Diagnosis not present

## 2021-09-25 DIAGNOSIS — F419 Anxiety disorder, unspecified: Secondary | ICD-10-CM | POA: Diagnosis not present

## 2021-09-25 DIAGNOSIS — R4589 Other symptoms and signs involving emotional state: Secondary | ICD-10-CM | POA: Diagnosis not present

## 2021-09-25 DIAGNOSIS — F325 Major depressive disorder, single episode, in full remission: Secondary | ICD-10-CM | POA: Diagnosis not present

## 2021-09-25 DIAGNOSIS — F4329 Adjustment disorder with other symptoms: Secondary | ICD-10-CM | POA: Diagnosis not present

## 2021-10-06 DIAGNOSIS — Z419 Encounter for procedure for purposes other than remedying health state, unspecified: Secondary | ICD-10-CM | POA: Diagnosis not present

## 2021-11-05 DIAGNOSIS — Z419 Encounter for procedure for purposes other than remedying health state, unspecified: Secondary | ICD-10-CM | POA: Diagnosis not present

## 2021-12-06 DIAGNOSIS — Z419 Encounter for procedure for purposes other than remedying health state, unspecified: Secondary | ICD-10-CM | POA: Diagnosis not present

## 2022-01-06 DIAGNOSIS — Z419 Encounter for procedure for purposes other than remedying health state, unspecified: Secondary | ICD-10-CM | POA: Diagnosis not present

## 2022-01-26 DIAGNOSIS — Z1329 Encounter for screening for other suspected endocrine disorder: Secondary | ICD-10-CM | POA: Diagnosis not present

## 2022-01-26 DIAGNOSIS — F4329 Adjustment disorder with other symptoms: Secondary | ICD-10-CM | POA: Diagnosis not present

## 2022-01-26 DIAGNOSIS — F325 Major depressive disorder, single episode, in full remission: Secondary | ICD-10-CM | POA: Diagnosis not present

## 2022-01-26 DIAGNOSIS — N926 Irregular menstruation, unspecified: Secondary | ICD-10-CM | POA: Diagnosis not present

## 2022-01-26 DIAGNOSIS — F419 Anxiety disorder, unspecified: Secondary | ICD-10-CM | POA: Diagnosis not present

## 2022-01-26 DIAGNOSIS — R4589 Other symptoms and signs involving emotional state: Secondary | ICD-10-CM | POA: Diagnosis not present

## 2022-01-26 DIAGNOSIS — Z113 Encounter for screening for infections with a predominantly sexual mode of transmission: Secondary | ICD-10-CM | POA: Diagnosis not present

## 2022-01-26 DIAGNOSIS — Z136 Encounter for screening for cardiovascular disorders: Secondary | ICD-10-CM | POA: Diagnosis not present

## 2022-02-02 ENCOUNTER — Telehealth: Payer: Medicaid Other | Admitting: Physician Assistant

## 2022-02-02 DIAGNOSIS — N926 Irregular menstruation, unspecified: Secondary | ICD-10-CM | POA: Diagnosis not present

## 2022-02-02 NOTE — Patient Instructions (Signed)
°  Lauren Adams, thank you for joining Piedad Climes, PA-C for today's virtual visit.  While this provider is not your primary care provider (PCP), if your PCP is located in our provider database this encounter information will be shared with them immediately following your visit.  Consent: (Patient) Lauren Adams provided verbal consent for this virtual visit at the beginning of the encounter.  Current Medications:  Current Outpatient Medications:    metroNIDAZOLE (FLAGYL) 500 MG tablet, Take by mouth., Disp: , Rfl:    escitalopram (LEXAPRO) 10 MG tablet, Take 1 tablet by mouth daily., Disp: , Rfl:    fluticasone (FLONASE) 50 MCG/ACT nasal spray, 2 sprays per nostril daily as needed (Patient taking differently: Place 2 sprays into both nostrils daily as needed for allergies. 2 sprays per nostril daily as needed), Disp: 18.2 g, Rfl: 5   levocetirizine (XYZAL) 5 MG tablet, Take 1 tablet (5 mg total) by mouth every evening. (Patient taking differently: No sig reported), Disp: 30 tablet, Rfl: 5   Olopatadine HCl (PAZEO) 0.7 % SOLN, Place 1 drop into both eyes daily as needed. (Patient not taking: No sig reported), Disp: 1 Bottle, Rfl: 5   Medications ordered in this encounter:  No orders of the defined types were placed in this encounter.    *If you need refills on other medications prior to your next appointment, please contact your pharmacy*  Follow-Up: Call back or seek an in-person evaluation if the symptoms worsen or if the condition fails to improve as anticipated.  Other Instructions Your blood pregnancy test done on 01/26/2022 with your primary care was negative which is very reassuring. If no period within next week, I want you to schedule a follow-up with them for re-evaluation.    If you have been instructed to have an in-person evaluation today at a local Urgent Care facility, please use the link below. It will take you to a list of all of our available [facility], including address, phone number and hours of operation. Please do not delay care.  [facility]  If you or a family member do not have a primary care provider, use the link below to schedule a visit and establish care. When you choose a [facility] primary care physician or advanced practice provider, you gain a long-term partner in health. Find a Primary Care Provider  Learn more about 's in-office and virtual care options:  - Get Care Now

## 2022-02-02 NOTE — Progress Notes (Signed)
Virtual Visit Consent   Lauren Adams, you are scheduled for a virtual visit with a [facility] provider today.     Just as with appointments in the office, your consent must be obtained to participate.  Your consent will be active for this visit and any virtual visit you may have with one of our providers in the next 365 days.     If you have a MyChart account, a copy of this consent can be sent to you electronically.  All virtual visits are billed to your insurance company just like a traditional visit in the office.    As this is a virtual visit, video technology does not allow for your provider to perform a traditional examination.  This may limit your provider's ability to fully assess your condition.  If your provider identifies any concerns that need to be evaluated in person or the need to arrange testing (such as labs, EKG, etc.), we will make arrangements to do so.     Although advances in technology are sophisticated, we cannot ensure that it will always work on either your end or our end.  If the connection with a video visit is poor, the visit may have to be switched to a telephone visit.  With either a video or telephone visit, we are not always able to ensure that we have a secure connection.     I need to obtain your verbal consent now.   Are you willing to proceed with your visit today?    Lauren Adams has provided verbal consent on 02/02/2022 for a virtual visit (video or telephone).   Lauren Adams, [place]   Date: 02/02/2022 2:23 PM   Virtual Visit via Video Note   I, Lauren Adams, connected with  Lauren Adams  ([id number], [birth year]) on 02/02/22 at  2:15 PM EST by a video-enabled telemedicine application and verified that I am speaking with the correct person using two identifiers.  Location: Patient: Virtual Visit Location Patient: Home Provider: Virtual Visit Location Provider: Home Office   I discussed the limitations of evaluation and  management by telemedicine and the availability of in person appointments. The patient expressed understanding and agreed to proceed.    History of Present Illness: Lauren Adams is a 21 y.o. who identifies as a female who was assigned female at birth, and is being seen today for delayed menstrual period. Patient notes her period was due about a week ago per her calendar but flow chart says she is about 4 days late. Notes she is sexually active with a female partner, having episode of unprotected sex on 2/22023. Took a Plan B the next morning. Noted some spotting on 2/7 lasting until 2/9. Was evaluated by her PCP with negative pregnancy test at that time and was told spotting likely due to Plan B use. No unprotected sex since that time. Is finishing treatment for BV with Flagyl -- 1 day left of medicine. She notes she still has not had her period. Took home pregnancy test today which was negative. She is wanting to know what might be the cause of her late period. Of note per [facility] she had a blood pregnancy test on 01/27/2020 with result of < 1.    HPI: HPI  Problems:  Patient Active Problem List   Diagnosis Date Noted   Liver issues with birth control pills 07/10/2020   Late period 07/10/2020   Angioedema 02/14/2019   Allergic reaction 02/14/2019  Allergic conjunctivitis 02/14/2019   Seasonal and perennial allergic rhinitis 02/14/2019   Conversion disorder with abnormal movement    Adjustment disorder with other symptom    VISUAL ACUITY, DECREASED 09/09/2010   CONSTIPATION, INTERMITTENT 09/09/2010    Allergies:  Allergies  Allergen Reactions   Drug Ingredient [Bee Pollen]    Medications:  Current Outpatient Medications:    metroNIDAZOLE (FLAGYL) 500 MG tablet, Take by mouth., Disp: , Rfl:    escitalopram (LEXAPRO) 10 MG tablet, Take 1 tablet by mouth daily., Disp: , Rfl:    fluticasone (FLONASE) 50 MCG/ACT nasal spray, 2 sprays per nostril daily as needed (Patient taking  differently: Place 2 sprays into both nostrils daily as needed for allergies. 2 sprays per nostril daily as needed), Disp: 18.2 g, Rfl: 5   levocetirizine (XYZAL) 5 MG tablet, Take 1 tablet (5 mg total) by mouth every evening. (Patient taking differently: No sig reported), Disp: 30 tablet, Rfl: 5   Olopatadine HCl (PAZEO) 0.7 % SOLN, Place 1 drop into both eyes daily as needed. (Patient not taking: No sig reported), Disp: 1 Bottle, Rfl: 5  Observations/Objective: Patient is well-developed, well-nourished in no acute distress.  Resting comfortably at home.  Head is normocephalic, atraumatic.  No labored breathing. Speech is clear and coherent with logical content.  Patient is alert and oriented at baseline.   Assessment and Plan: 1. Late period  Likely due to hormonal fluctuations giving she took Plan B after unprotected sex and has had multiple negative POC pregnancy tests and a negative beta HCG blood test within past week. Will have her monitor for period over the rest of the week, into next week. If still not present she is to follow-up with her PCP for repeat evaluation as if < 3 weeks of gestation at time of blood drawn she may not have adequate levels of HCG to show a positive result.   Follow Up Instructions: I discussed the assessment and treatment plan with the patient. The patient was provided an opportunity to ask questions and all were answered. The patient agreed with the plan and demonstrated an understanding of the instructions.  A copy of instructions were sent to the patient via MyChart unless otherwise noted below.   The patient was advised to call back or seek an in-person evaluation if the symptoms worsen or if the condition fails to improve as anticipated.  Time:  I spent 10 minutes with the patient via telehealth technology discussing the above problems/concerns.    Lauren Climes, PA-C

## 2022-02-03 DIAGNOSIS — Z419 Encounter for procedure for purposes other than remedying health state, unspecified: Secondary | ICD-10-CM | POA: Diagnosis not present

## 2022-03-06 DIAGNOSIS — Z419 Encounter for procedure for purposes other than remedying health state, unspecified: Secondary | ICD-10-CM | POA: Diagnosis not present

## 2022-04-05 DIAGNOSIS — Z419 Encounter for procedure for purposes other than remedying health state, unspecified: Secondary | ICD-10-CM | POA: Diagnosis not present

## 2022-05-06 DIAGNOSIS — Z419 Encounter for procedure for purposes other than remedying health state, unspecified: Secondary | ICD-10-CM | POA: Diagnosis not present

## 2022-05-07 DIAGNOSIS — Z3202 Encounter for pregnancy test, result negative: Secondary | ICD-10-CM | POA: Diagnosis not present

## 2022-05-07 DIAGNOSIS — R220 Localized swelling, mass and lump, head: Secondary | ICD-10-CM | POA: Diagnosis not present

## 2022-05-07 DIAGNOSIS — N760 Acute vaginitis: Secondary | ICD-10-CM | POA: Diagnosis not present

## 2022-05-07 DIAGNOSIS — Z113 Encounter for screening for infections with a predominantly sexual mode of transmission: Secondary | ICD-10-CM | POA: Diagnosis not present

## 2022-06-05 DIAGNOSIS — Z419 Encounter for procedure for purposes other than remedying health state, unspecified: Secondary | ICD-10-CM | POA: Diagnosis not present

## 2022-07-06 DIAGNOSIS — Z419 Encounter for procedure for purposes other than remedying health state, unspecified: Secondary | ICD-10-CM | POA: Diagnosis not present

## 2022-08-06 DIAGNOSIS — Z419 Encounter for procedure for purposes other than remedying health state, unspecified: Secondary | ICD-10-CM | POA: Diagnosis not present

## 2022-09-05 DIAGNOSIS — Z419 Encounter for procedure for purposes other than remedying health state, unspecified: Secondary | ICD-10-CM | POA: Diagnosis not present

## 2022-10-06 DIAGNOSIS — Z419 Encounter for procedure for purposes other than remedying health state, unspecified: Secondary | ICD-10-CM | POA: Diagnosis not present

## 2022-10-11 DIAGNOSIS — S93522A Sprain of metatarsophalangeal joint of left great toe, initial encounter: Secondary | ICD-10-CM | POA: Diagnosis not present

## 2022-10-31 DIAGNOSIS — N760 Acute vaginitis: Secondary | ICD-10-CM | POA: Diagnosis not present

## 2022-11-05 DIAGNOSIS — Z419 Encounter for procedure for purposes other than remedying health state, unspecified: Secondary | ICD-10-CM | POA: Diagnosis not present

## 2022-11-11 DIAGNOSIS — M21612 Bunion of left foot: Secondary | ICD-10-CM | POA: Diagnosis not present

## 2022-11-11 DIAGNOSIS — M79675 Pain in left toe(s): Secondary | ICD-10-CM | POA: Diagnosis not present

## 2022-11-11 DIAGNOSIS — S93522D Sprain of metatarsophalangeal joint of left great toe, subsequent encounter: Secondary | ICD-10-CM | POA: Diagnosis not present

## 2022-11-22 ENCOUNTER — Ambulatory Visit: Payer: Medicaid Other | Admitting: Podiatry

## 2022-11-22 ENCOUNTER — Ambulatory Visit (INDEPENDENT_AMBULATORY_CARE_PROVIDER_SITE_OTHER): Payer: Medicaid Other

## 2022-11-22 ENCOUNTER — Encounter: Payer: Self-pay | Admitting: Podiatry

## 2022-11-22 ENCOUNTER — Ambulatory Visit: Payer: Medicaid Other

## 2022-11-22 DIAGNOSIS — M7752 Other enthesopathy of left foot: Secondary | ICD-10-CM | POA: Diagnosis not present

## 2022-11-22 DIAGNOSIS — M2012 Hallux valgus (acquired), left foot: Secondary | ICD-10-CM | POA: Diagnosis not present

## 2022-11-22 MED ORDER — DEXAMETHASONE SODIUM PHOSPHATE 120 MG/30ML IJ SOLN
2.0000 mg | Freq: Once | INTRAMUSCULAR | Status: AC
  Administered 2022-11-22: 2 mg via INTRA_ARTICULAR

## 2022-11-22 NOTE — Progress Notes (Signed)
  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 female presents with the above complaint.   ROS: Denies fever chills nausea vomit muscle aches pains calf pain back pain chest pain shortness of breath.  Past Medical History:  Diagnosis Date   Depression 2022   Eczema    Liver issues with birth control pills 07/10/2020   Seizures ([facility]CC)    Past Surgical History:  Procedure Laterality Date   no surgical history      Current Outpatient Medications:    meloxicam (MOBIC) 15 MG tablet, Take 15 mg by mouth daily., Disp: , Rfl:   Allergies  Allergen Reactions   Drug Ingredient [Bee Pollen]    Review of Systems Objective:  There were no vitals filed for this visit.  General: Well developed, nourished, in no acute distress, alert and oriented x3   Dermatological: Skin is warm, dry and supple bilateral. Nails x 10 are well maintained; remaining integument appears unremarkable at this time. There are no open sores, no preulcerative lesions, no rash or signs of infection present.  Vascular: Dorsalis Pedis artery and Posterior Tibial artery pedal pulses are 2/4 bilateral with immedate capillary fill time. Pedal hair growth present. No varicosities and no lower extremity edema present bilateral.   Neruologic: Grossly intact via light touch bilateral. Vibratory intact via tuning fork bilateral. Protective threshold with Semmes Wienstein monofilament intact to all pedal sites bilateral. Patellar and Achilles deep tendon reflexes 2+ bilateral. No Babinski or clonus noted bilateral.   Musculoskeletal: No gross boney pedal deformities bilateral. No pain, crepitus, or limitation noted with  foot and ankle range of motion bilateral. Muscular strength 5/5 in all groups tested bilateral.  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  Mild tenderness on direct palpation of the hypertrophic medial condyle of the head of the first metatarsal.  She has good full range of motion without crepitation.  It is easily reducible.  Gait: Unassisted, Nonantalgic.    Radiographs:  Radiographs taken today demonstrate an osseously mature individual good bone density.  She has an increase in the first and metatarsal angle greater than normal value.  Hallux abductus angle is greater than normal value hypertrophic medial condyle is also noted to the head of the first metatarsal.  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  Plan: Discussed etiology pathology conservative versus surgical therapies.  Injected the bursa today at the dorsal medial aspect of the first metatarsal phalangeal joint.  We did discuss her flatfoot deformity also discussed surgery for bunion deformity.  I will follow-up with her on an as-needed basis.     Era Parr T. [place], [place]

## 2022-12-06 DIAGNOSIS — Z419 Encounter for procedure for purposes other than remedying health state, unspecified: Secondary | ICD-10-CM | POA: Diagnosis not present

## 2023-01-05 ENCOUNTER — Encounter: Payer: Self-pay | Admitting: Podiatry

## 2023-01-05 ENCOUNTER — Ambulatory Visit: Payer: Medicaid Other | Admitting: Podiatry

## 2023-01-05 DIAGNOSIS — M7752 Other enthesopathy of left foot: Secondary | ICD-10-CM | POA: Diagnosis not present

## 2023-01-05 NOTE — Progress Notes (Signed)
She presents today for follow-up of her painful first metatarsophalangeal joint of her left foot.  She states that the shot helped out for about a week or so then just returned back to the throbbing.  She states that it hurts to walk on her to wear shoes at all times.  Objective: Vital signs are stable alert and oriented x 3.  I have reviewed her past medical history medications allergies surgery social history and radiographs.  She has pain on correction of the bunion deformity with range of motion of the first metatarsophalangeal joint without crepitus but has no pain on angular deformity range of motion.  Assessment: Hallux abductovalgus deformity with capsulitis bursitis first metatarsophalangeal joint left.  Plan: Discussed etiology pathology and surgical therapies due to failure of conservative therapies to alleviate this patient's symptomatology we are going to go ahead and consent her for an [facility] bunion repair withscrew fixation.  We discussed in great detail the possible postop complications and side effects associated with the surgery which may include but not limited to postop pain bleeding swelling infection recurrence need for further surgery overcorrection under correction loss of digit loss limb loss of life.  We discussed anesthesia and anesthesia group at the surgery center.  Will follow-up with her in the near future for surgery.

## 2023-01-06 DIAGNOSIS — Z419 Encounter for procedure for purposes other than remedying health state, unspecified: Secondary | ICD-10-CM | POA: Diagnosis not present

## 2023-01-10 ENCOUNTER — Telehealth: Payer: Self-pay | Admitting: Podiatry

## 2023-01-10 NOTE — Telephone Encounter (Signed)
DOS: 01/21/2023  [place] Medicaid [place] [phone])  Prior authorization is not required per fax received from [facility].

## 2023-01-12 DIAGNOSIS — M79676 Pain in unspecified toe(s): Secondary | ICD-10-CM

## 2023-01-19 ENCOUNTER — Other Ambulatory Visit: Payer: Self-pay | Admitting: Podiatry

## 2023-01-19 MED ORDER — OXYCODONE-ACETAMINOPHEN 10-325 MG PO TABS: 1.0000 | ORAL_TABLET | Freq: Three times a day (TID) | ORAL | 0 refills | Status: AC | PRN

## 2023-01-19 MED ORDER — CEPHALEXIN 500 MG PO CAPS: 500.0000 mg | ORAL_CAPSULE | Freq: Three times a day (TID) | ORAL | 0 refills | Status: DC

## 2023-01-19 MED ORDER — ONDANSETRON HCL 4 MG PO TABS: 4.0000 mg | ORAL_TABLET | Freq: Three times a day (TID) | ORAL | 0 refills | Status: DC | PRN

## 2023-01-21 ENCOUNTER — Encounter: Payer: Self-pay | Admitting: *Deleted

## 2023-01-21 DIAGNOSIS — M21612 Bunion of left foot: Secondary | ICD-10-CM | POA: Diagnosis not present

## 2023-01-21 DIAGNOSIS — M2012 Hallux valgus (acquired), left foot: Secondary | ICD-10-CM | POA: Diagnosis not present

## 2023-01-21 DIAGNOSIS — G8918 Other acute postprocedural pain: Secondary | ICD-10-CM | POA: Diagnosis not present

## 2023-01-26 ENCOUNTER — Ambulatory Visit (INDEPENDENT_AMBULATORY_CARE_PROVIDER_SITE_OTHER): Payer: Medicaid Other | Admitting: Podiatry

## 2023-01-26 ENCOUNTER — Ambulatory Visit (INDEPENDENT_AMBULATORY_CARE_PROVIDER_SITE_OTHER): Payer: Medicaid Other

## 2023-01-26 DIAGNOSIS — M2012 Hallux valgus (acquired), left foot: Secondary | ICD-10-CM

## 2023-01-26 DIAGNOSIS — Z9889 Other specified postprocedural states: Secondary | ICD-10-CM

## 2023-01-26 NOTE — Progress Notes (Signed)
She presents today for her first postop visit date of surgery is 01/21/2023 [facility] bunionectomy with screw fixation.  She denies fever chills nausea vomit muscle aches and pains states that it was stiff and numb feeling as it woke up.  She states that she has been trying to move her toes.  Objective: Vital signs are stable she is alert and oriented x 3 pulses are palpable sterile dressing intact with boot on the left side.  Once the dressing was removed demonstrates no erythema minimal edema no cellulitis drainage or odor sutures are intact margins are well coapted she has good passive range of motion.  Radiographs taken today demonstrate an [facility] bunion repair double K wire fixation intact and in good position.  Internal fixation is in good position.  Assessment: Well-healing surgical foot.  Plan: Redressed today dressed a compressive dressing follow-up with her in in 1 week.  Recommend that she continue elevation and active range of motion exercises.

## 2023-02-04 DIAGNOSIS — Z419 Encounter for procedure for purposes other than remedying health state, unspecified: Secondary | ICD-10-CM | POA: Diagnosis not present

## 2023-02-08 ENCOUNTER — Ambulatory Visit (INDEPENDENT_AMBULATORY_CARE_PROVIDER_SITE_OTHER): Payer: Medicaid Other | Admitting: Podiatry

## 2023-02-08 ENCOUNTER — Encounter: Payer: Self-pay | Admitting: Podiatry

## 2023-02-08 VITALS — BP 97/63 | HR 96

## 2023-02-08 DIAGNOSIS — Z9889 Other specified postprocedural states: Secondary | ICD-10-CM

## 2023-02-08 NOTE — Progress Notes (Signed)
   Chief Complaint  Patient presents with   Routine Post Op    "It's getting there.  I can feel it tightening up, so it's harder to move it."    Subjective:  Patient presents today status post bunionectomy with first metatarsal osteotomy left foot.  DOS: 01/21/2023.  Patient doing well.  She is WBAT in the cam boot as instructed.  No new complaints at this time  Past Medical History:  Diagnosis Date   Depression 2022   Eczema    Liver issues with birth control pills 07/10/2020   Seizures ([place])     Past Surgical History:  Procedure Laterality Date   no surgical history      Allergies  Allergen Reactions   Drug Ingredient [Bee Pollen]     Objective/Physical Exam Neurovascular status intact.  Incision well coapted with sutures intact. No sign of infectious process noted. No dehiscence. No active bleeding noted.  Moderate edema noted to the surgical extremity.  Good alignment of the first ray clinically   Assessment: 1. s/p bunionectomy with first metatarsal osteotomy left. DOS: 01/21/2023  -Patient evaluated -Patient may begin washing and showering and getting the foot wet -Compression ankle sleeve dispensed.  Wear daily -Continue WBAT cam boot -Return to clinic 2 weeks follow-up x-ray with Dr. Milinda Pointer    Edrick Kins, DPM [facility]  Dr. Edrick Kins, DPM    [address], [place] [address]                Office [phone]  Fax [phone]

## 2023-02-11 ENCOUNTER — Ambulatory Visit
Admission: EM | Admit: 2023-02-11 | Discharge: 2023-02-11 | Disposition: A | Discharge status: Home / Self Care | Payer: Medicaid Other | Attending: Emergency Medicine | Admitting: Emergency Medicine

## 2023-02-11 DIAGNOSIS — Z113 Encounter for screening for infections with a predominantly sexual mode of transmission: Secondary | ICD-10-CM | POA: Diagnosis not present

## 2023-02-11 DIAGNOSIS — Z3202 Encounter for pregnancy test, result negative: Secondary | ICD-10-CM | POA: Insufficient documentation

## 2023-02-11 DIAGNOSIS — N898 Other specified noninflammatory disorders of vagina: Secondary | ICD-10-CM | POA: Insufficient documentation

## 2023-02-11 LAB — POCT URINALYSIS DIP (MANUAL ENTRY)
Bilirubin, UA: NEGATIVE
Blood, UA: NEGATIVE
Glucose, UA: NEGATIVE mg/dL
Ketones, POC UA: NEGATIVE mg/dL
Leukocytes, UA: NEGATIVE
Nitrite, UA: NEGATIVE
Protein Ur, POC: NEGATIVE mg/dL
Spec Grav, UA: 1.025 (ref 1.010–1.025)
Urobilinogen, UA: 0.2 E.U./dL
pH, UA: 7 (ref 5.0–8.0)

## 2023-02-11 LAB — POCT URINE PREGNANCY: Preg Test, Ur: NEGATIVE

## 2023-02-11 MED ORDER — FLUCONAZOLE 150 MG PO TABS: 150.0000 mg | ORAL_TABLET | Freq: Every day | ORAL | 0 refills | Status: DC

## 2023-02-11 NOTE — ED Provider Notes (Signed)
Lauren Adams    CSN: [id number] Arrival date & time: 02/11/23  1323      History   Chief Complaint Chief Complaint  Patient presents with   Vaginal Discharge    HPI Lauren Adams is a 22 y.o. female.  Patient presents with 10 day history of vaginal discharge and vaginal irritation.  She states this is similar to previous episodes of yeast infection and she recently completed a course of cephalexin after foot surgery.  She requests STD testing.  She denies fever, chills, rash, abdominal pain, vomiting, diarrhea, dysuria, hematuria, pelvic pain, or other symptoms.  No treatment at home.    The history is provided by the patient and medical records.    Past Medical History:  Diagnosis Date   Depression 2022   Eczema    Liver issues with birth control pills 07/10/2020   Seizures [facility])     Patient Active Problem List   Diagnosis Date Noted   Major depressive disorder with single episode, in full remission ([place]) 01/06/2021   Anxiety 01/06/2021   Liver issues with birth control pills 07/10/2020   Late period 07/10/2020   Angioedema 02/14/2019   Allergic reaction 02/14/2019   Allergic conjunctivitis 02/14/2019   Seasonal and perennial allergic rhinitis 02/14/2019   Seasonal and perennial allergic rhinitis 02/14/2019   Conversion disorder with abnormal movement    Adjustment disorder with other symptom    VISUAL ACUITY, DECREASED 09/09/2010   CONSTIPATION, INTERMITTENT 09/09/2010    Past Surgical History:  Procedure Laterality Date   FOOT SURGERY Left    no surgical history      OB History     Gravida  0   Para  0   Term  0   Preterm  0   AB  0   Living  0      SAB  0   IAB  0   Ectopic  0   Multiple  0   Live Births  0            Home Medications    Prior to Admission medications   Medication Sig Start Date End Date Taking? Authorizing Provider  fluconazole (DIFLUCAN) 150 MG tablet Take 1 tablet (150 mg total) by mouth  daily. Take one tablet today.  May repeat in 3 days. 02/11/23  Yes Sharion Balloon, NP  cephALEXin (KEFLEX) 500 MG capsule Take 1 capsule (500 mg total) by mouth 3 (three) times daily. Patient not taking: Reported on 02/08/2023 01/19/23   Tyson Dense T, DPM  meloxicam (MOBIC) 15 MG tablet Take 15 mg by mouth daily. Patient not taking: Reported on 02/08/2023    [provider]  ondansetron (ZOFRAN) 4 MG tablet Take 1 tablet (4 mg total) by mouth every 8 (eight) hours as needed. Patient not taking: Reported on 02/08/2023 01/19/23   Garrel Ridgel, DPM    Family History Family History  Problem Relation Age of Onset   Carpal tunnel syndrome Mother    Hypertension Maternal Aunt    Kidney disease Maternal Aunt    Diabetes Maternal Aunt    Diabetes Maternal Grandmother    Hypertension Maternal Grandmother    Kidney disease Maternal Grandmother    Asthma Other    Eczema Other    Angioedema Neg Hx    Immunodeficiency Neg Hx    Urticaria Neg Hx     Social History Social History   Tobacco Use   Smoking status: Never   Smokeless  tobacco: Never  Vaping Use   Vaping Use: Former  Substance Use Topics   Alcohol use: Yes    Comment: occasionally   Drug use: Yes    Types: Marijuana     Allergies   Drug ingredient [bee pollen]   Review of Systems Review of Systems  Constitutional:  Negative for chills and fever.  Gastrointestinal:  Negative for abdominal pain, constipation, diarrhea, nausea and vomiting.  Genitourinary:  Positive for vaginal discharge. Negative for dysuria, flank pain, hematuria and pelvic pain.  Skin:  Negative for rash.  All other systems reviewed and are negative.    Physical Exam Triage Vital Signs ED Triage Vitals  Enc Vitals Group     BP      Pulse      Resp      Temp      Temp src      SpO2      Weight      Height      Head Circumference      Peak Flow      Pain Score      Pain Loc      Pain Edu?      Excl. in [place]?    No data found.  Updated  Vital Signs BP 91/61   Pulse 81   Temp 97.9 F (36.6 C)   Resp 18   LMP 01/27/2023 (Exact Date)   SpO2 98%   Visual Acuity Right Eye Distance:   Left Eye Distance:   Bilateral Distance:    Right Eye Near:   Left Eye Near:    Bilateral Near:     Physical Exam Vitals and nursing note reviewed.  Constitutional:      General: She is not in acute distress.    Appearance: Normal appearance. She is well-developed. She is not ill-appearing.  HENT:     Mouth/Throat:     Mouth: Mucous membranes are moist.  Eyes:     Conjunctiva/sclera: Conjunctivae normal.  Cardiovascular:     Rate and Rhythm: Normal rate and regular rhythm.     Heart sounds: Normal heart sounds.  Pulmonary:     Effort: Pulmonary effort is normal. No respiratory distress.     Breath sounds: Normal breath sounds.  Abdominal:     General: Bowel sounds are normal.     Palpations: Abdomen is soft.     Tenderness: There is no abdominal tenderness. There is no right CVA tenderness, left CVA tenderness, guarding or rebound.  Genitourinary:    Comments: Patient declines GU exam. Musculoskeletal:     Cervical back: Neck supple.  Skin:    General: Skin is warm and dry.  Neurological:     Mental Status: She is alert.  Psychiatric:        Mood and Affect: Mood normal.        Behavior: Behavior normal.      UC Treatments / Results  Labs (all labs ordered are listed, but only abnormal results are displayed) Labs Reviewed  RPR  HIV ANTIBODY (ROUTINE TESTING W REFLEX)  POCT URINE PREGNANCY  POCT URINALYSIS DIP (MANUAL ENTRY)  CERVICOVAGINAL ANCILLARY ONLY    EKG   Radiology No results found.  Procedures Procedures (including critical care time)  Medications Ordered in UC Medications - No data to display  Initial Impression / Assessment and Plan / UC Course  I have reviewed the triage vital signs and the nursing notes.  Pertinent labs & imaging results that were available  during my care of the  patient were reviewed by me and considered in my medical decision making (see chart for details).    STD screening, vaginal discharge, vaginal irritation, negative pregnancy test.  Patient obtained vaginal self swab for testing.  Treating with Diflucan.  HIV and RPR pending also.  Discussed that we will call if test results are positive.  Discussed that she may require additional treatment at that time.  Discussed that sexual partner(s) may also require treatment.  Instructed patient to abstain from sexual activity for at least 7 days.  Instructed her to follow-up with her PCP or gynecologist if her symptoms are not improving.  Patient agrees to plan of care.   Final Clinical Impressions(s) / UC Diagnoses   Final diagnoses:  Screening for STD (sexually transmitted disease)  Vaginal discharge  Vaginal irritation  Negative pregnancy test     Discharge Instructions      Take the Diflucan as directed.    Your urine is normal and urine pregnancy is negative.    Your tests are pending.  If your test results are positive, we will call you.  You and your sexual partner(s) may require treatment at that time.  Do not have sexual activity for at least 7 days.    Follow up with your primary care provider if your symptoms are not improving.            ED Prescriptions     Medication Sig Dispense Auth. Provider   fluconazole (DIFLUCAN) 150 MG tablet Take 1 tablet (150 mg total) by mouth daily. Take one tablet today.  May repeat in 3 days. 2 tablet Sharion Balloon, NP      PDMP not reviewed this encounter.   Sharion Balloon, NP 02/11/23 1357

## 2023-02-11 NOTE — Discharge Instructions (Addendum)
Take the Diflucan as directed.    Your urine is normal and urine pregnancy is negative.    Your tests are pending.  If your test results are positive, we will call you.  You and your sexual partner(s) may require treatment at that time.  Do not have sexual activity for at least 7 days.    Follow up with your primary care provider if your symptoms are not improving.

## 2023-02-11 NOTE — ED Triage Notes (Addendum)
Patient to Urgent Care with complaints of vaginal discharge x10 days. Request STD swab and blood testing  Reports at the end of her cycle she is prone to yeast/ BV. Recently finished keflex prescription following foot surgery.

## 2023-02-12 LAB — RPR: RPR Ser Ql: NONREACTIVE

## 2023-02-12 LAB — HIV ANTIBODY (ROUTINE TESTING W REFLEX): HIV Screen 4th Generation wRfx: NONREACTIVE

## 2023-02-14 LAB — CERVICOVAGINAL ANCILLARY ONLY
Bacterial Vaginitis (gardnerella): POSITIVE — AB
Candida Glabrata: POSITIVE — AB
Candida Vaginitis: NEGATIVE
Chlamydia: NEGATIVE
Comment: NEGATIVE
Comment: NEGATIVE
Comment: NEGATIVE
Comment: NEGATIVE
Comment: NEGATIVE
Comment: NORMAL
Neisseria Gonorrhea: NEGATIVE
Trichomonas: NEGATIVE

## 2023-02-15 ENCOUNTER — Telehealth (HOSPITAL_COMMUNITY): Payer: Self-pay | Admitting: Emergency Medicine

## 2023-02-15 MED ORDER — METRONIDAZOLE 0.75 % VA GEL: 1.0000 | Freq: Every day | VAGINAL | 0 refills | Status: AC

## 2023-02-23 DIAGNOSIS — M79676 Pain in unspecified toe(s): Secondary | ICD-10-CM

## 2023-03-01 ENCOUNTER — Encounter: Payer: Medicaid Other | Admitting: Podiatry

## 2023-03-07 DIAGNOSIS — Z419 Encounter for procedure for purposes other than remedying health state, unspecified: Secondary | ICD-10-CM | POA: Diagnosis not present

## 2023-03-08 ENCOUNTER — Encounter: Payer: Self-pay | Admitting: Podiatry

## 2023-03-08 ENCOUNTER — Ambulatory Visit (INDEPENDENT_AMBULATORY_CARE_PROVIDER_SITE_OTHER): Payer: Medicaid Other | Admitting: Podiatry

## 2023-03-08 ENCOUNTER — Ambulatory Visit (INDEPENDENT_AMBULATORY_CARE_PROVIDER_SITE_OTHER): Payer: Medicaid Other

## 2023-03-08 VITALS — BP 111/69 | HR 81

## 2023-03-08 DIAGNOSIS — Z9889 Other specified postprocedural states: Secondary | ICD-10-CM

## 2023-03-08 NOTE — Progress Notes (Addendum)
   Chief Complaint  Patient presents with   Routine Post Op    "It's going well.  My toe feels numb."    Subjective:  Patient presents today status post bunionectomy with first metatarsal osteotomy left foot.  DOS: 01/21/2023.  Patient doing well.  She is WBAT in the cam boot as instructed.  No new complaints at this time  Past Medical History:  Diagnosis Date   Depression 2022   Eczema    Liver issues with birth control pills 07/10/2020   Seizures     Past Surgical History:  Procedure Laterality Date   FOOT SURGERY Left    no surgical history      Allergies  Allergen Reactions   Drug Ingredient [Bee Pollen]     Objective/Physical Exam Neurovascular status intact.  Incision nicely healed.  Minimal edema.  Overall well-healing surgical foot with good alignment of the first ray  Radiographic exam LT foot 03/08/2023 Osteotomy noted to the distal portion of the first metatarsal with intact and stable K wire fixation.  Routine healing noted.  Assessment: 1. s/p bunionectomy with first metatarsal osteotomy left. DOS: 01/21/2023  -Patient evaluated -Patient may begin washing and showering and getting the foot wet -Compression ankle sleeve daily - Discontinue cam boot.  Patient may now begin wearing good supportive tennis shoes and sneakers.  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 -Return to clinic as needed    Edrick Kins, DPM [facility]  Dr. Edrick Kins, DPM    [address], [place] [address]                Office [phone]  Fax [phone]

## 2023-04-06 DIAGNOSIS — Z419 Encounter for procedure for purposes other than remedying health state, unspecified: Secondary | ICD-10-CM | POA: Diagnosis not present

## 2023-05-07 DIAGNOSIS — Z419 Encounter for procedure for purposes other than remedying health state, unspecified: Secondary | ICD-10-CM | POA: Diagnosis not present

## 2023-05-09 DIAGNOSIS — S4991XA Unspecified injury of right shoulder and upper arm, initial encounter: Secondary | ICD-10-CM | POA: Diagnosis not present

## 2023-05-09 DIAGNOSIS — M25511 Pain in right shoulder: Secondary | ICD-10-CM | POA: Diagnosis not present

## 2023-05-09 DIAGNOSIS — M25521 Pain in right elbow: Secondary | ICD-10-CM | POA: Diagnosis not present

## 2023-05-09 DIAGNOSIS — Z743 Need for continuous supervision: Secondary | ICD-10-CM | POA: Diagnosis not present

## 2023-05-09 DIAGNOSIS — S59901A Unspecified injury of right elbow, initial encounter: Secondary | ICD-10-CM | POA: Diagnosis not present

## 2023-06-06 DIAGNOSIS — Z419 Encounter for procedure for purposes other than remedying health state, unspecified: Secondary | ICD-10-CM | POA: Diagnosis not present

## 2023-06-08 ENCOUNTER — Telehealth: Payer: Federal, State, Local not specified - PPO | Admitting: Family Medicine

## 2023-06-08 DIAGNOSIS — B3731 Acute candidiasis of vulva and vagina: Secondary | ICD-10-CM | POA: Diagnosis not present

## 2023-06-08 MED ORDER — FLUCONAZOLE 150 MG PO TABS: 150.0000 mg | ORAL_TABLET | Freq: Once | ORAL | 0 refills | Status: AC

## 2023-06-08 NOTE — Patient Instructions (Addendum)
Lauren Adams, thank you for joining Freddy Finner, NP for today's virtual visit.  While this provider is not your primary care provider (PCP), if your PCP is located in our provider database this encounter information will be shared with them immediately following your visit.   A [facility] MyChart account gives you access to today's visit and all your visits, tests, and labs performed at [facility] " click here if you don't have a [facility] MyChart account or go to [email or web address]  Consent: (Patient) Lauren Adams provided verbal consent for this virtual visit at the beginning of the encounter.  Current Medications:  Current Outpatient Medications:    fluconazole (DIFLUCAN) 150 MG tablet, Take 1 tablet (150 mg total) by mouth once for 1 dose., Disp: 1 tablet, Rfl: 0   cephALEXin (KEFLEX) 500 MG capsule, Take 1 capsule (500 mg total) by mouth 3 (three) times daily. (Patient not taking: Reported on 03/08/2023), Disp: 30 capsule, Rfl: 0   meloxicam (MOBIC) 15 MG tablet, Take 15 mg by mouth daily. (Patient not taking: Reported on 03/08/2023), Disp: , Rfl:    ondansetron (ZOFRAN) 4 MG tablet, Take 1 tablet (4 mg total) by mouth every 8 (eight) hours as needed. (Patient not taking: Reported on 03/08/2023), Disp: 20 tablet, Rfl: 0   Medications ordered in this encounter:  Meds ordered this encounter  Medications   fluconazole (DIFLUCAN) 150 MG tablet    Sig: Take 1 tablet (150 mg total) by mouth once for 1 dose.    Dispense:  1 tablet    Refill:  0    Order Specific Question:   Supervising Provider    Answer:   Merrilee Jansky [id number]     *If you need refills on other medications prior to your next appointment, please contact your pharmacy*  Follow-Up: Call back or seek an in-person evaluation if the symptoms worsen or if the condition fails to improve as anticipated.  [facility] [phone]  Other Instructions Vaginal Yeast Infection,  Adult  Vaginal yeast infection is a condition that causes vaginal discharge as well as soreness, swelling, and redness (inflammation) of the vagina. This is a common condition. Some women get this infection frequently. What are the causes? This condition is caused by a change in the normal balance of the yeast (Candida) and normal bacteria that live in the vagina. This change causes an overgrowth of yeast, which causes the inflammation. What increases the risk? The condition is more likely to develop in women who: Take antibiotic medicines. Have diabetes. Take birth control pills. Are pregnant. Douche often. Have a weak body defense system (immune system). Have been taking steroid medicines for a long time. Frequently wear tight clothing. What are the signs or symptoms? Symptoms of this condition include: White, thick, creamy vaginal discharge. Swelling, itching, redness, and irritation of the vagina. The lips of the vagina (labia) may be affected as well. Pain or a burning feeling while urinating. Pain during sex. How is this diagnosed? This condition is diagnosed based on: Your medical history. A physical exam. A pelvic exam. Your health care provider will examine a sample of your vaginal discharge under a microscope. Your health care provider may send this sample for testing to confirm the diagnosis. How is this treated? This condition is treated with medicine. Medicines may be over-the-counter or prescription. You may be told to use one or more of the following: Medicine that is taken by mouth (orally). Medicine that is  applied as a cream (topically). Medicine that is inserted directly into the vagina (suppository). Follow these instructions at home: Take or apply over-the-counter and prescription medicines only as told by your health care provider. Do not use tampons until your health care provider approves. Do not have sex until your infection has cleared. Sex can prolong or  worsen your symptoms of infection. Ask your health care provider when it is safe to resume sexual activity. Keep all follow-up visits. This is important. How is this prevented?  Do not wear tight clothes, such as pantyhose or tight pants. Wear breathable cotton underwear. Do not use douches, perfumed soap, creams, or powders. Wipe from front to back after using the toilet. If you have diabetes, keep your blood sugar levels under control. Ask your health care provider for other ways to prevent yeast infections. Contact a health care provider if: You have a fever. Your symptoms go away and then return. Your symptoms do not get better with treatment. Your symptoms get worse. You have new symptoms. You develop blisters in or around your vagina. You have blood coming from your vagina and it is not your menstrual period. You develop pain in your abdomen. Summary Vaginal yeast infection is a condition that causes discharge as well as soreness, swelling, and redness (inflammation) of the vagina. This condition is treated with medicine. Medicines may be over-the-counter or prescription. Take or apply over-the-counter and prescription medicines only as told by your health care provider. Do not douche. Resume sexual activity or use of tampons as instructed by your health care provider. Contact a health care provider if your symptoms do not get better with treatment or your symptoms go away and then return. This information is not intended to replace advice given to you by your health care provider. Make sure you discuss any questions you have with your health care provider. Document Revised: 02/09/2021 Document Reviewed: 02/09/2021 Elsevier Patient Education  2024 Elsevier Inc.    If you have been instructed to have an in-person evaluation today at a local Urgent Care facility, please use the link below. It will take you to a list of all of our available [facility], including  address, phone number and hours of operation. Please do not delay care.  [facility]  If you or a family member do not have a primary care provider, use the link below to schedule a visit and establish care. When you choose a [facility] primary care physician or advanced practice provider, you gain a long-term partner in health. Find a Primary Care Provider  Learn more about [facility]'s in-office and virtual care options: [facility] Get Care Now

## 2023-06-08 NOTE — Progress Notes (Signed)
Virtual Visit Consent   Lauren Adams, you are scheduled for a virtual visit with a [facility] provider today. Just as with appointments in the office, your consent must be obtained to participate. Your consent will be active for this visit and any virtual visit you may have with one of our providers in the next 365 days. If you have a MyChart account, a copy of this consent can be sent to you electronically.  As this is a virtual visit, video technology does not allow for your provider to perform a traditional examination. This may limit your provider's ability to fully assess your condition. If your provider identifies any concerns that need to be evaluated in person or the need to arrange testing (such as labs, EKG, etc.), we will make arrangements to do so. Although advances in technology are sophisticated, we cannot ensure that it will always work on either your end or our end. If the connection with a video visit is poor, the visit may have to be switched to a telephone visit. With either a video or telephone visit, we are not always able to ensure that we have a secure connection.  By engaging in this virtual visit, you consent to the provision of healthcare and authorize for your insurance to be billed (if applicable) for the services provided during this visit. Depending on your insurance coverage, you may receive a charge related to this service.  I need to obtain your verbal consent now. Are you willing to proceed with your visit today? [name] has provided verbal consent on 06/08/2023 for a virtual visit (video or telephone). Freddy Finner, NP  Date: 06/08/2023 11:44 AM  Virtual Visit via Video Note   I, Freddy Finner, connected with  KALIEE ROBARDS  ([id number], [birth year]) on 06/08/23 at 11:45 AM EDT by a video-enabled telemedicine application and verified that I am speaking with the correct person using two identifiers.  Location: Patient: Virtual Visit Location  Patient: Home Provider: Virtual Visit Location Provider: Home Office   I discussed the limitations of evaluation and management by telemedicine and the availability of in person appointments. The patient expressed understanding and agreed to proceed.    History of Present Illness: Lauren Adams is a 22 y.o. who identifies as a female who was assigned female at birth, and is being seen today for vaginal discharge  Onset was 2-3 days ago with white discharge- white and clumpy Associated symptoms are none Modifying factors are none Denies chest pain, shortness of breath, fevers, chills, no UTI signs    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   Anxiety 01/06/2021   Liver issues with birth control pills 07/10/2020   Late period 07/10/2020   Angioedema 02/14/2019   Allergic reaction 02/14/2019   Allergic conjunctivitis 02/14/2019   Seasonal and perennial allergic rhinitis 02/14/2019   Seasonal and perennial allergic rhinitis 02/14/2019   Conversion disorder with abnormal movement    Adjustment disorder with other symptom    VISUAL ACUITY, DECREASED 09/09/2010   CONSTIPATION, INTERMITTENT 09/09/2010    Allergies:  Allergies  Allergen Reactions   Drug Ingredient [Bee Pollen]    Medications:  Current Outpatient Medications:    cephALEXin (KEFLEX) 500 MG capsule, Take 1 capsule (500 mg total) by mouth 3 (three) times daily. (Patient not taking: Reported on 03/08/2023), Disp: 30 capsule, Rfl: 0   fluconazole (DIFLUCAN)  150 MG tablet, Take 1 tablet (150 mg total) by mouth daily. Take one tablet today.  May repeat in 3 days. (Patient not taking: Reported on 03/08/2023), Disp: 2 tablet, Rfl: 0   meloxicam (MOBIC) 15 MG tablet, Take 15 mg by mouth daily. (Patient not taking: Reported on 03/08/2023), Disp: , Rfl:    ondansetron (ZOFRAN) 4 MG tablet, Take 1 tablet (4 mg total) by mouth  every 8 (eight) hours as needed. (Patient not taking: Reported on 03/08/2023), Disp: 20 tablet, Rfl: 0  Observations/Objective: Patient is well-developed, well-nourished in no acute distress.  Resting comfortably  at home.  Head is normocephalic, atraumatic.  No labored breathing.  Speech is clear and coherent with logical content.  Patient is alert and oriented at baseline.    Assessment and Plan:  1. Yeast vaginitis  - fluconazole (DIFLUCAN) 150 MG tablet; Take 1 tablet (150 mg total) by mouth once for 1 dose.  Dispense: 1 tablet; Refill: 0  .vaginal health info on AVS -follow up if not improving   Reviewed side effects, risks and benefits of medication.    Patient acknowledged agreement and understanding of the plan.   Past Medical, Surgical, Social History, Allergies, and Medications have been Reviewed.    Follow Up Instructions: I discussed the assessment and treatment plan with the patient. The patient was provided an opportunity to ask questions and all were answered. The patient agreed with the plan and demonstrated an understanding of the instructions.  A copy of instructions were sent to the patient via MyChart unless otherwise noted below.    The patient was advised to call back or seek an in-person evaluation if the symptoms worsen or if the condition fails to improve as anticipated.  Time:  I spent 10 minutes with the patient via telehealth technology discussing the above problems/concerns.    Freddy Finner, NP

## 2023-06-30 ENCOUNTER — Ambulatory Visit
Admission: EM | Admit: 2023-06-30 | Discharge: 2023-06-30 | Disposition: A | Discharge status: Home / Self Care | Payer: Federal, State, Local not specified - PPO | Attending: Emergency Medicine | Admitting: Emergency Medicine

## 2023-06-30 DIAGNOSIS — N898 Other specified noninflammatory disorders of vagina: Secondary | ICD-10-CM | POA: Diagnosis not present

## 2023-06-30 DIAGNOSIS — Z113 Encounter for screening for infections with a predominantly sexual mode of transmission: Secondary | ICD-10-CM | POA: Diagnosis not present

## 2023-06-30 LAB — POCT URINALYSIS DIP (MANUAL ENTRY)
Blood, UA: NEGATIVE
Glucose, UA: NEGATIVE mg/dL
Leukocytes, UA: NEGATIVE
Nitrite, UA: NEGATIVE
Spec Grav, UA: 1.02 (ref 1.010–1.025)
Urobilinogen, UA: 2 E.U./dL — AB
pH, UA: 7.5 (ref 5.0–8.0)

## 2023-06-30 LAB — POCT URINE PREGNANCY: Preg Test, Ur: NEGATIVE

## 2023-06-30 MED ORDER — FLUCONAZOLE 150 MG PO TABS: 150.0000 mg | ORAL_TABLET | Freq: Once | ORAL | 1 refills | Status: AC

## 2023-06-30 MED ORDER — METRONIDAZOLE 0.75 % VA GEL: VAGINAL | 0 refills | Status: DC

## 2023-06-30 NOTE — ED Triage Notes (Signed)
Pt states she is having white discharge that started 2 weeks ago, did get prescribed diflucan and felt better and now having white discharge again.

## 2023-06-30 NOTE — ED Provider Notes (Signed)
HPI  SUBJECTIVE:  Lauren Adams is a 22 y.o. female who presents with white,  questionably odorous vaginal discharge for the past week.  No urinary complaints, genital rash, abnormal vaginal bleeding.  No nausea, vomiting, fevers, abdominal, back, pelvic pain.  She is is in a monogamous relationship with a new female partner, who is asymptomatic.  She tried some leftover metronidazole vaginal gel for 2 doses with improvement in her symptoms.  No aggravating factors.  She states that she had thick, white cottage cheese vaginal discharge after finishing menses on 7/8, had a virtual visit, was prescribed Diflucan on 7/12 with complete resolution in her symptoms. Patient has a past medical history of seizures, frequent BV, yeast, chlamydia.  Has never had gonorrhea, HSV, syphilis, trichomonas, diabetes.  LMP: 7/8.  Not sure if she could be pregnant.  Would like for [place] to check.  PCP: None.  Past Medical History:  Diagnosis Date   Depression 2022   Eczema    Liver issues with birth control pills 07/10/2020   Seizures (HCC)     Past Surgical History:  Procedure Laterality Date   FOOT SURGERY Left    no surgical history      Family History  Problem Relation Age of Onset   Carpal tunnel syndrome Mother    Hypertension Maternal Aunt    Kidney disease Maternal Aunt    Diabetes Maternal Aunt    Diabetes Maternal Grandmother    Hypertension Maternal Grandmother    Kidney disease Maternal Grandmother    Asthma Other    Eczema Other    Angioedema Neg Hx    Immunodeficiency Neg Hx    Urticaria Neg Hx     Social History   Tobacco Use   Smoking status: Never   Smokeless tobacco: Never  Vaping Use   Vaping status: Former  Substance Use Topics   Alcohol use: Not Currently    Comment: occasionally   Drug use: Not Currently    Types: Marijuana    No current facility-administered medications for this encounter.  Current Outpatient Medications:    fluconazole (DIFLUCAN) 150 MG tablet,  Take 1 tablet (150 mg total) by mouth once for 1 dose. 1 tab po x 1. May repeat in 72 hours if no improvement, Disp: 2 tablet, Rfl: 1   metroNIDAZOLE (METROGEL) 0.75 % vaginal gel, 1 applicator full pv qhs x 5 days, Disp: 70 g, Rfl: 0  Allergies  Allergen Reactions   Drug Ingredient [Bee Pollen]      ROS  As noted in HPI.   Physical Exam  BP 102/64 (BP Location: Right Arm)   Pulse (!) 58   Temp 98.3 F (36.8 C) (Oral)   Resp 16   LMP 06/09/2023 (Exact Date)   SpO2 98%   Constitutional: Well developed, well nourished, no acute distress Eyes:  EOMI, conjunctiva normal bilaterally HENT: Normocephalic, atraumatic,mucus membranes moist Respiratory: Normal inspiratory effort Cardiovascular: Normal rate GI: nondistended soft, nontender. No suprapubic, lower quadrant, flank tenderness  back: No CVA tenderness GU: Deferred skin: No rash, skin intact Musculoskeletal: no deformities Neurologic: Alert & oriented x 3, no focal neuro deficits Psychiatric: Speech and behavior appropriate   ED Course   Medications - No data to display  Orders Placed This Encounter  Procedures   HIV Antibody (routine testing w rflx)    Standing Status:   Standing    Number of Occurrences:   1   RPR    Standing Status:   Standing  Number of Occurrences:   1   POCT urinalysis dipstick    Standing Status:   Standing    Number of Occurrences:   1   POCT urine pregnancy    Standing Status:   Standing    Number of Occurrences:   1    Results for orders placed or performed during the hospital encounter of 06/30/23 (from the past 24 hour(s))  POCT urinalysis dipstick     Status: Abnormal   Collection Time: 06/30/23 10:28 AM  Result Value Ref Range   Color, UA yellow yellow   Clarity, UA cloudy (A) clear   Glucose, UA negative negative mg/dL   Bilirubin, UA small (A) negative   Ketones, POC UA small (15) (A) negative mg/dL   Spec Grav, UA 5.621 3.086 - 1.025   Blood, UA negative negative    pH, UA 7.5 5.0 - 8.0   Protein Ur, POC trace (A) negative mg/dL   Urobilinogen, UA 2.0 (A) 0.2 or 1.0 E.U./dL   Nitrite, UA Negative Negative   Leukocytes, UA Negative Negative  POCT urine pregnancy     Status: None   Collection Time: 06/30/23 10:46 AM  Result Value Ref Range   Preg Test, Ur Negative Negative   No results found.  ED Clinical Impression  1. Vaginal discharge   2. Screening for STDs (sexually transmitted diseases)     ED Assessment/Plan    Previous labs reviewed.  As noted in HPI.  UA negative for UTI.  She has no urinary complaints.  [phone]  H&P most c/w yeast infection versus BV. Sent off swab for GC/chlamydia, trichomonas, BV and yeast.  Also checking urine pregnancy, HIV, RPR per patient request. Will not treat empirically now.  Will send home with MetroGel, diflucan for yeast infection.   Urine pregnancy negative.  Advised pt to refrain from sexual contact until she knows lab results, symptoms resolve, and partner(s) are treated if necessary. Pt provided working phone number. Follow-up with PCP of choice as needed.  Will provide primary care list for routine care.  Discussed labs, MDM, plan and followup with patient. Pt agrees with plan.   Meds ordered this encounter  Medications   fluconazole (DIFLUCAN) 150 MG tablet    Sig: Take 1 tablet (150 mg total) by mouth once for 1 dose. 1 tab po x 1. May repeat in 72 hours if no improvement    Dispense:  2 tablet    Refill:  1   metroNIDAZOLE (METROGEL) 0.75 % vaginal gel    Sig: 1 applicator full pv qhs x 5 days    Dispense:  70 g    Refill:  0    *This clinic note was created using Scientist, clinical (histocompatibility and immunogenetics). Therefore, there may be occasional mistakes despite careful proofreading.  ?     Domenick Gong, MD 06/30/23 1049

## 2023-06-30 NOTE — Discharge Instructions (Signed)
Your urine pregnancy is negative.  I am treating you empirically for bacterial vaginosis and yeast today.  Make sure you finish all of the antibiotics, even if you feel better. Give [place] a working phone number so that we can contact you if needed. Refrain from sexual contact until all of your labs have come back, symptoms have resolved, and your partner(s) are treated if necessary. Return to the ER if you get worse, have a fever >100.4, or for any concerns.   Here is a list of primary care providers who are taking new patients:  [facility] [place] Dr. Joseph Berkshire (sports medicine) Dr. Elizabeth Sauer [address] [place] [address] [phone]  [facility] at [facility] [address] [place], [place] [address] [phone]  [facility] [place] [address]  [place] [address]  [phone]  [facility] [address] [place], [place] [address] [phone]  [facility] [address] [place]  [phone] [place], [place] [address]  Here are clinics/ other resources who will see you if you do not have insurance. Some have certain criteria that you must meet. Call them and find out what they are:  [facility]: [address] [place], [place] [address] Phone: [phone] Hours: First and Third Saturdays of each Month, 9 a.m. - 1 p.m.  [facility]: [address], [address] [place], [place] [address] Phone: [phone] Hours: Tuesday, 4 p.m. - 8 p.m. Thursday, 1 p.m. - 8 p.m. Wednesday, 9 a.m. - [facility] [address] [place], [place] [address] Phone: [phone] Pharmacy Phone Number: [phone] Dental Phone Number: [phone] [facility] Insurance Help: [phone]  Dental Hours: Monday - Thursday, 8 a.m. - 6 p.m.  [facility] [address] [place], [place] [address] Phone: [phone] Pharmacy Phone Number: [phone] [facility] Insurance Help:  [phone]  [facility] [address] [place] [place], [place] [address] Phone: [phone] Pharmacy Phone Number: [phone] [facility] Insurance Help: [phone]  [facility] [address] [place], [place] [address] Phone: [phone] [facility] Insurance Help: [phone]   [facility] [address] [place], [place] [address] Phone: [phone]  Go to www.goodrx.com  or www.costplusdrugs.com to look up your medications. This will give you a list of where you can find your prescriptions at the most affordable prices. Or ask the pharmacist what the cash price is, or if they have any other discount programs available to help make your medication more affordable. This can be less expensive than what you would pay with insurance.

## 2023-07-07 DIAGNOSIS — Z419 Encounter for procedure for purposes other than remedying health state, unspecified: Secondary | ICD-10-CM | POA: Diagnosis not present

## 2023-07-18 DIAGNOSIS — M25561 Pain in right knee: Secondary | ICD-10-CM | POA: Diagnosis not present

## 2023-07-18 DIAGNOSIS — M7051 Other bursitis of knee, right knee: Secondary | ICD-10-CM | POA: Diagnosis not present

## 2023-08-07 DIAGNOSIS — Z419 Encounter for procedure for purposes other than remedying health state, unspecified: Secondary | ICD-10-CM | POA: Diagnosis not present

## 2023-09-06 DIAGNOSIS — Z419 Encounter for procedure for purposes other than remedying health state, unspecified: Secondary | ICD-10-CM | POA: Diagnosis not present

## 2023-10-05 ENCOUNTER — Telehealth: Payer: Federal, State, Local not specified - PPO | Admitting: Physician Assistant

## 2023-10-05 DIAGNOSIS — B3731 Acute candidiasis of vulva and vagina: Secondary | ICD-10-CM

## 2023-10-05 MED ORDER — FLUCONAZOLE 150 MG PO TABS: 150.0000 mg | ORAL_TABLET | Freq: Every day | ORAL | 0 refills | Status: DC

## 2023-10-05 NOTE — Progress Notes (Signed)

## 2023-10-05 NOTE — Progress Notes (Signed)
I have spent 5 minutes in review of e-visit questionnaire, review and updating patient chart, medical decision making and response to patient.   Mia Milan Cody Jacklynn Dehaas, PA-C    

## 2023-10-07 DIAGNOSIS — Z419 Encounter for procedure for purposes other than remedying health state, unspecified: Secondary | ICD-10-CM | POA: Diagnosis not present

## 2023-11-01 DIAGNOSIS — M79645 Pain in left finger(s): Secondary | ICD-10-CM | POA: Diagnosis not present

## 2023-11-02 DIAGNOSIS — M2391 Unspecified internal derangement of right knee: Secondary | ICD-10-CM | POA: Diagnosis not present

## 2023-11-02 DIAGNOSIS — M79645 Pain in left finger(s): Secondary | ICD-10-CM | POA: Diagnosis not present

## 2023-11-06 DIAGNOSIS — Z419 Encounter for procedure for purposes other than remedying health state, unspecified: Secondary | ICD-10-CM | POA: Diagnosis not present

## 2023-11-09 DIAGNOSIS — M2391 Unspecified internal derangement of right knee: Secondary | ICD-10-CM | POA: Diagnosis not present

## 2023-11-14 DIAGNOSIS — M222X1 Patellofemoral disorders, right knee: Secondary | ICD-10-CM | POA: Diagnosis not present

## 2023-11-17 DIAGNOSIS — R202 Paresthesia of skin: Secondary | ICD-10-CM | POA: Diagnosis not present

## 2023-12-07 DIAGNOSIS — Z419 Encounter for procedure for purposes other than remedying health state, unspecified: Secondary | ICD-10-CM | POA: Diagnosis not present

## 2023-12-15 DIAGNOSIS — M79645 Pain in left finger(s): Secondary | ICD-10-CM | POA: Diagnosis not present

## 2023-12-16 DIAGNOSIS — M222X1 Patellofemoral disorders, right knee: Secondary | ICD-10-CM | POA: Diagnosis not present

## 2023-12-29 DIAGNOSIS — M25561 Pain in right knee: Secondary | ICD-10-CM | POA: Diagnosis not present

## 2023-12-29 DIAGNOSIS — M222X1 Patellofemoral disorders, right knee: Secondary | ICD-10-CM | POA: Diagnosis not present

## 2024-01-06 ENCOUNTER — Ambulatory Visit
Admission: RE | Admit: 2024-01-06 | Discharge: 2024-01-06 | Disposition: A | Discharge status: Home / Self Care | Payer: Federal, State, Local not specified - PPO | Source: Ambulatory Visit | Attending: Emergency Medicine | Admitting: Emergency Medicine

## 2024-01-06 DIAGNOSIS — N898 Other specified noninflammatory disorders of vagina: Secondary | ICD-10-CM | POA: Diagnosis not present

## 2024-01-06 LAB — POCT URINALYSIS DIP (MANUAL ENTRY)
Bilirubin, UA: NEGATIVE
Blood, UA: NEGATIVE
Glucose, UA: NEGATIVE mg/dL
Ketones, POC UA: NEGATIVE mg/dL
Leukocytes, UA: NEGATIVE
Nitrite, UA: NEGATIVE
Spec Grav, UA: >=1.03 — AB (ref 1.010–1.025)
Urobilinogen, UA: 1 U/dL
pH, UA: 6 (ref 5.0–8.0)

## 2024-01-06 MED ORDER — METRONIDAZOLE 500 MG PO TABS: 500.0000 mg | ORAL_TABLET | Freq: Two times a day (BID) | ORAL | 0 refills | Status: DC

## 2024-01-06 NOTE — Discharge Instructions (Signed)
Today you are being treated prophylactically for  Bacterial vaginosis   Urinalysis negative   Take Metronidazole 500 mg twice a day for 7 days, do not drink alcohol while using medication, this will make you feel sick   Bacterial vaginosis which results from an overgrowth of one on several organisms that are normally present in your vagina. Vaginosis is an inflammation of the vagina that can result in discharge, itching and pain.  Labs pending 2-3 days, you will be contacted if positive for any sti and treatment will be sent to the pharmacy, you will have to return to the clinic if positive for gonorrhea to receive treatment   Please refrain from having sex until labs results, if positive please refrain from having sex until treatment complete and symptoms resolve   If positive for HIV, Syphilis, Chlamydia  gonorrhea or trichomoniasis please notify partner or partners so they may tested as well  Moving forward, it is recommended you use some form of protection against the transmission of sti infections  such as condoms or dental dams with each sexual encounter     In addition: Avoid baths, hot tubs and whirlpool spas.  Don't use scented or harsh soaps Avoid irritants. These include scented tampons and pads. Wipe from front to back after using the toilet. Don't douche. Your vagina doesn't require cleansing other than normal bathing.  Use a condom.  Wear cotton underwear, this fabric absorbs some moisture.

## 2024-01-06 NOTE — ED Triage Notes (Signed)
 Triaged by provider

## 2024-01-06 NOTE — ED Provider Notes (Signed)
Lauren Adams    CSN: [id number] Arrival date & time: 01/06/24  1241      History   Chief Complaint Chief Complaint  Patient presents with   Vaginal Discharge    Possibly uti but also want to get  tested  for everything - Entered by patient    HPI Lauren Adams is a 23 y.o. female.   Patient presents for evaluation of urinary urgency, thin Hicks Feick vaginal discharge with vaginal odor present for 7 days.  Associated general malaise.  Has attempted use of an ointment and cranberry juice for treatment.  Sexually active denies known exposure.  Last menstrual period December 14, 2023.  Denies frequency, dysuria, lower abdominal pain or flank pain, fever, vaginal itching.  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   Anxiety 01/06/2021   Liver issues with birth control pills 07/10/2020   Late period 07/10/2020   Angioedema 02/14/2019   Allergic reaction 02/14/2019   Allergic conjunctivitis 02/14/2019   Seasonal and perennial allergic rhinitis 02/14/2019   Seasonal and perennial allergic rhinitis 02/14/2019   Conversion disorder with abnormal movement    Adjustment disorder with other symptom    VISUAL ACUITY, DECREASED 09/09/2010   Constipation 09/09/2010    Past Surgical History:  Procedure Laterality Date   FOOT SURGERY Left    no surgical history      OB History     Gravida  0   Para  0   Term  0   Preterm  0   AB  0   Living  0      SAB  0   IAB  0   Ectopic  0   Multiple  0   Live Births  0            Home Medications    Prior to Admission medications   Medication Sig Start Date End Date Taking? Authorizing Provider  metroNIDAZOLE (FLAGYL) 500 MG tablet Take 1 tablet (500 mg total) by mouth 2  (two) times daily. 01/06/24  Yes Luceal Hollibaugh R, NP  fluconazole (DIFLUCAN) 150 MG tablet Take 1 tablet (150 mg total) by mouth daily. 10/05/23   Waldon Merl, PA-C    Family History Family History  Problem Relation Age of Onset   Carpal tunnel syndrome Mother    Hypertension Maternal Aunt    Kidney disease Maternal Aunt    Diabetes Maternal Aunt    Diabetes Maternal Grandmother    Hypertension Maternal Grandmother    Kidney disease Maternal Grandmother    Asthma Other    Eczema Other    Angioedema Neg Hx    Immunodeficiency Neg Hx    Urticaria Neg Hx     Social History Social History   Tobacco Use   Smoking status: Never   Smokeless tobacco: Never  Vaping Use   Vaping status: Former  Substance Use Topics   Alcohol use: Not Currently    Comment: occasionally   Drug use: Not Currently    Types: Marijuana     Allergies   Drug ingredient [bee pollen]   Review of Systems Review of Systems   Physical Exam Triage Vital Signs ED Triage Vitals  Encounter Vitals Group  BP      Systolic BP Percentile      Diastolic BP Percentile      Pulse      Resp      Temp      Temp src      SpO2      Weight      Height      Head Circumference      Peak Flow      Pain Score      Pain Loc      Pain Education      Exclude from Growth Chart    No data found.  Updated Vital Signs There were no vitals taken for this visit.  Visual Acuity Right Eye Distance:   Left Eye Distance:   Bilateral Distance:    Right Eye Near:   Left Eye Near:    Bilateral Near:     Physical Exam Constitutional:      Appearance: Normal appearance.  Eyes:     Extraocular Movements: Extraocular movements intact.  Pulmonary:     Effort: Pulmonary effort is normal.  Abdominal:     Tenderness: There is no right CVA tenderness or left CVA tenderness.  Neurological:     Mental Status: She is alert and oriented to person, place, and time. Mental status is at baseline.       UC Treatments / Results  Labs (all labs ordered are listed, but only abnormal results are displayed) Labs Reviewed  POCT URINALYSIS DIP (MANUAL ENTRY) - Abnormal; Notable for the following components:      Result Value   Clarity, UA cloudy (*)    Spec Grav, UA >=1.030 (*)    Protein Ur, POC trace (*)    All other components within normal limits  RPR  HIV ANTIBODY (ROUTINE TESTING W REFLEX)  CERVICOVAGINAL ANCILLARY ONLY    EKG   Radiology No results found.  Procedures Procedures (including critical care time)  Medications Ordered in UC Medications - No data to display  Initial Impression / Assessment and Plan / UC Course  I have reviewed the triage vital signs and the nursing notes.  Pertinent labs & imaging results that were available during my care of the patient were reviewed by me and considered in my medical decision making (see chart for details).  Vaginal Discharge  urinalysis negative ,treating prophylactically for BV, metronidazole sent to pharmacy, advised against alcohol use during treatment, STI labs pending, will treat further per protocol, advised abstinence until lab results results, treatment is complete and all symptoms are resolved, recommended follow-up as needed Final Clinical Impressions(s) / UC Diagnoses   Final diagnoses:  Vaginal discharge     Discharge Instructions      Today you are being treated prophylactically for  Bacterial vaginosis   Urinalysis negative   Take Metronidazole 500 mg twice a day for 7 days, do not drink alcohol while using medication, this will make you feel sick   Bacterial vaginosis which results from an overgrowth of one on several organisms that are normally present in your vagina. Vaginosis is an inflammation of the vagina that can result in discharge, itching and pain.  Labs pending 2-3 days, you will be contacted if positive for any sti and treatment will be sent to the pharmacy, you will have to  return to the clinic if positive for gonorrhea to receive treatment   Please refrain from having sex until labs results, if positive please refrain from having sex until  treatment complete and symptoms resolve   If positive for HIV, Syphilis, Chlamydia  gonorrhea or trichomoniasis please notify partner or partners so they may tested as well  Moving forward, it is recommended you use some form of protection against the transmission of sti infections  such as condoms or dental dams with each sexual encounter     In addition: Avoid baths, hot tubs and whirlpool spas.  Don't use scented or harsh soaps Avoid irritants. These include scented tampons and pads. Wipe from front to back after using the toilet. Don't douche. Your vagina doesn't require cleansing other than normal bathing.  Use a condom.  Wear cotton underwear, this fabric absorbs some moisture.        ED Prescriptions     Medication Sig Dispense Auth. Provider   metroNIDAZOLE (FLAGYL) 500 MG tablet Take 1 tablet (500 mg total) by mouth 2 (two) times daily. 14 tablet Verna Desrocher, Elita Boone, NP      PDMP not reviewed this encounter.   Valinda Hoar, NP 01/06/24 1332

## 2024-01-07 DIAGNOSIS — Z419 Encounter for procedure for purposes other than remedying health state, unspecified: Secondary | ICD-10-CM | POA: Diagnosis not present

## 2024-01-07 LAB — RPR: RPR Ser Ql: NONREACTIVE

## 2024-01-07 LAB — HIV ANTIBODY (ROUTINE TESTING W REFLEX): HIV Screen 4th Generation wRfx: NONREACTIVE

## 2024-01-09 LAB — CERVICOVAGINAL ANCILLARY ONLY
Bacterial Vaginitis (gardnerella): POSITIVE — AB
Candida Glabrata: NEGATIVE
Candida Vaginitis: NEGATIVE
Chlamydia: NEGATIVE
Comment: NEGATIVE
Comment: NEGATIVE
Comment: NEGATIVE
Comment: NEGATIVE
Comment: NEGATIVE
Comment: NORMAL
Neisseria Gonorrhea: NEGATIVE
Trichomonas: NEGATIVE

## 2024-01-23 DIAGNOSIS — M25561 Pain in right knee: Secondary | ICD-10-CM | POA: Diagnosis not present

## 2024-01-23 DIAGNOSIS — M222X1 Patellofemoral disorders, right knee: Secondary | ICD-10-CM | POA: Diagnosis not present

## 2024-01-30 DIAGNOSIS — M25561 Pain in right knee: Secondary | ICD-10-CM | POA: Diagnosis not present

## 2024-01-30 DIAGNOSIS — M222X1 Patellofemoral disorders, right knee: Secondary | ICD-10-CM | POA: Diagnosis not present

## 2024-02-04 DIAGNOSIS — Z419 Encounter for procedure for purposes other than remedying health state, unspecified: Secondary | ICD-10-CM | POA: Diagnosis not present

## 2024-02-10 DIAGNOSIS — M222X1 Patellofemoral disorders, right knee: Secondary | ICD-10-CM | POA: Diagnosis not present

## 2024-03-02 DIAGNOSIS — M25561 Pain in right knee: Secondary | ICD-10-CM | POA: Diagnosis not present

## 2024-03-02 DIAGNOSIS — M222X1 Patellofemoral disorders, right knee: Secondary | ICD-10-CM | POA: Diagnosis not present

## 2024-03-17 DIAGNOSIS — Z419 Encounter for procedure for purposes other than remedying health state, unspecified: Secondary | ICD-10-CM | POA: Diagnosis not present

## 2024-03-28 ENCOUNTER — Telehealth: Admitting: Urgent Care

## 2024-03-28 DIAGNOSIS — B3731 Acute candidiasis of vulva and vagina: Secondary | ICD-10-CM | POA: Diagnosis not present

## 2024-03-28 MED ORDER — FLUCONAZOLE 150 MG PO TABS: 150.0000 mg | ORAL_TABLET | Freq: Every day | ORAL | 0 refills | Status: DC

## 2024-03-28 NOTE — Patient Instructions (Signed)
  Lauren Adams, thank you for joining Mandy Second, PA for today's virtual visit.  While this provider is not your primary care provider (PCP), if your PCP is located in our provider database this encounter information will be shared with them immediately following your visit.   A [facility] MyChart account gives you access to today's visit and all your visits, tests, and labs performed at [facility] " click here if you don't have a [facility] MyChart account or go to [email or web address]  Consent: (Patient) Lauren Adams provided verbal consent for this virtual visit at the beginning of the encounter.  Current Medications:  Current Outpatient Medications:    fluconazole  (DIFLUCAN ) 150 MG tablet, Take 1 tablet (150 mg total) by mouth daily., Disp: 2 tablet, Rfl: 0   metroNIDAZOLE  (FLAGYL ) 500 MG tablet, Take 1 tablet (500 mg total) by mouth 2 (two) times daily., Disp: 14 tablet, Rfl: 0   Medications ordered in this encounter:  Meds ordered this encounter  Medications   fluconazole  (DIFLUCAN ) 150 MG tablet    Sig: Take 1 tablet (150 mg total) by mouth daily.    Dispense:  2 tablet    Refill:  0     *If you need refills on other medications prior to your next appointment, please contact your pharmacy*  Follow-Up: Call back or seek an in-person evaluation if the symptoms worsen or if the condition fails to improve as anticipated.  [facility] [phone]  Other Instructions Take one tablet of diflucan  now. May repeat in 72 hours if needed. Other OTC medications include Miconazole. If your symptoms do not respond, please follow up at an in person urgent care for further testing.   If you have been instructed to have an in-person evaluation today at a local Urgent Care facility, please use the link below. It will take you to a list of all of our available [facility], including address, phone number and hours of operation.  Please do not delay care.  [facility]  If you or a family member do not have a primary care provider, use the link below to schedule a visit and establish care. When you choose a [facility] primary care physician or advanced practice provider, you gain a long-term partner in health. Find a Primary Care Provider  Learn more about [facility]'s in-office and virtual care options: [facility] - Get Care Now

## 2024-03-28 NOTE — Progress Notes (Signed)
 Virtual Visit Consent   Lauren Adams, you are scheduled for a virtual visit with a [facility] provider today. Just as with appointments in the office, your consent must be obtained to participate. Your consent will be active for this visit and any virtual visit you may have with one of our providers in the next 365 days. If you have a MyChart account, a copy of this consent can be sent to you electronically.  As this is a virtual visit, video technology does not allow for your provider to perform a traditional examination. This may limit your provider's ability to fully assess your condition. If your provider identifies any concerns that need to be evaluated in person or the need to arrange testing (such as labs, EKG, etc.), we will make arrangements to do so. Although advances in technology are sophisticated, we cannot ensure that it will always work on either your end or our end. If the connection with a video visit is poor, the visit may have to be switched to a telephone visit. With either a video or telephone visit, we are not always able to ensure that we have a secure connection.  By engaging in this virtual visit, you consent to the provision of healthcare and authorize for your insurance to be billed (if applicable) for the services provided during this visit. Depending on your insurance coverage, you may receive a charge related to this service.  I need to obtain your verbal consent now. Are you willing to proceed with your visit today? Lauren Adams has provided verbal consent on 03/28/2024 for a virtual visit (video or telephone). Mandy Second, [place]  Date: 03/28/2024 6:48 PM   Virtual Visit via Video Note   I, Mandy Second, connected with  Lauren Adams  ([id number], [birth year]) on 03/28/24 at  6:45 PM EDT by a video-enabled telemedicine application and verified that I am speaking with the correct person using two identifiers.  Location: Patient: Virtual Visit Location  Patient: Home Provider: Virtual Visit Location Provider: Home Office   I discussed the limitations of evaluation and management by telemedicine and the availability of in person appointments. The patient expressed understanding and agreed to proceed.    History of Present Illness: Lauren Adams is a 23 y.o. who identifies as a female who was assigned female at birth, and is being seen today for vaginal itching.  HPI: HPI   23yo female presents for concerns of yeast infection. Has hx of them, states sx feel similar. Since Friday, has been having irritation and itching. No noted discharge, no odor. Monogamous relationship, no concern for STI. Had BV in Jan, sx today are not similar. Has not tried any OTC meds as didn't know what to take. Denies urinary sx or pelvic pain. Certain clothes irritate skin externally. No lesions/ rashes.   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   Anxiety 01/06/2021   Liver issues with birth control pills 07/10/2020   Late period 07/10/2020   Angioedema 02/14/2019   Allergic reaction 02/14/2019   Allergic conjunctivitis 02/14/2019   Seasonal and perennial allergic rhinitis 02/14/2019   Seasonal and perennial allergic rhinitis 02/14/2019   Conversion disorder with abnormal movement    Adjustment disorder with other symptom    VISUAL ACUITY, DECREASED 09/09/2010   Constipation 09/09/2010    Allergies:  Allergies  Allergen Reactions   Drug Ingredient Rhonda Certain  Pollen]    Medications:  Current Outpatient Medications:    fluconazole  (DIFLUCAN ) 150 MG tablet, Take 1 tablet (150 mg total) by mouth daily., Disp: 1 tablet, Rfl: 0   metroNIDAZOLE  (FLAGYL ) 500 MG tablet, Take 1 tablet (500 mg total) by mouth 2 (two) times daily., Disp: 14 tablet, Rfl: 0  Observations/Objective: Patient is well-developed, well-nourished in no acute distress.  Resting  comfortably at home.  Head is normocephalic, atraumatic.  No labored breathing.  Speech is clear and coherent with logical content.  Patient is alert and oriented at baseline.    Assessment and Plan: 1. Yeast vaginitis (Primary)  Start diflucan  x 1. May repeat in 72 hours if needed. If sx fail to respond, will need in office vaginal swab.  Follow Up Instructions: I discussed the assessment and treatment plan with the patient. The patient was provided an opportunity to ask questions and all were answered. The patient agreed with the plan and demonstrated an understanding of the instructions.  A copy of instructions were sent to the patient via MyChart unless otherwise noted below.    The patient was advised to call back or seek an in-person evaluation if the symptoms worsen or if the condition fails to improve as anticipated.    Mandy Second, PA

## 2024-04-16 DIAGNOSIS — Z419 Encounter for procedure for purposes other than remedying health state, unspecified: Secondary | ICD-10-CM | POA: Diagnosis not present

## 2024-05-14 DIAGNOSIS — Z3202 Encounter for pregnancy test, result negative: Secondary | ICD-10-CM | POA: Diagnosis not present

## 2024-05-14 DIAGNOSIS — N898 Other specified noninflammatory disorders of vagina: Secondary | ICD-10-CM | POA: Diagnosis not present

## 2024-05-14 DIAGNOSIS — B3731 Acute candidiasis of vulva and vagina: Secondary | ICD-10-CM | POA: Diagnosis not present

## 2024-05-14 DIAGNOSIS — Z202 Contact with and (suspected) exposure to infections with a predominantly sexual mode of transmission: Secondary | ICD-10-CM | POA: Diagnosis not present

## 2024-05-17 DIAGNOSIS — Z419 Encounter for procedure for purposes other than remedying health state, unspecified: Secondary | ICD-10-CM | POA: Diagnosis not present

## 2024-06-16 DIAGNOSIS — Z419 Encounter for procedure for purposes other than remedying health state, unspecified: Secondary | ICD-10-CM | POA: Diagnosis not present

## 2024-07-17 DIAGNOSIS — Z419 Encounter for procedure for purposes other than remedying health state, unspecified: Secondary | ICD-10-CM | POA: Diagnosis not present

## 2024-08-07 DIAGNOSIS — Z3201 Encounter for pregnancy test, result positive: Secondary | ICD-10-CM | POA: Diagnosis not present

## 2024-08-17 DIAGNOSIS — Z419 Encounter for procedure for purposes other than remedying health state, unspecified: Secondary | ICD-10-CM | POA: Diagnosis not present

## 2024-08-24 ENCOUNTER — Other Ambulatory Visit: Payer: Self-pay

## 2024-08-24 ENCOUNTER — Emergency Department
Admission: EM | Admit: 2024-08-24 | Discharge: 2024-08-24 | Disposition: A | Discharge status: Home / Self Care | Attending: Emergency Medicine | Admitting: Emergency Medicine

## 2024-08-24 DIAGNOSIS — R202 Paresthesia of skin: Secondary | ICD-10-CM | POA: Diagnosis not present

## 2024-08-24 DIAGNOSIS — Z3A01 Less than 8 weeks gestation of pregnancy: Secondary | ICD-10-CM | POA: Insufficient documentation

## 2024-08-24 DIAGNOSIS — O26821 Pregnancy related peripheral neuritis, first trimester: Secondary | ICD-10-CM | POA: Diagnosis not present

## 2024-08-24 MED ORDER — LIDOCAINE 5 % EX PTCH
1.0000 | MEDICATED_PATCH | Freq: Once | CUTANEOUS | Status: DC
  Administered 2024-08-24: 1 via TRANSDERMAL
  Filled 2024-08-24: qty 1

## 2024-08-24 NOTE — ED Notes (Signed)
 Pt states while she was standing up at work she noticed her left leg go numb and then it started tingling. She states the numbness feeling went away but the tingling has stayed. Pt denies any extend trips/long travel  and that nothing like this has happened in the past. While doing the pt's neuro screen she states her left leg does feel different than her right and that its only her left leg.

## 2024-08-24 NOTE — Discharge Instructions (Signed)
 Your exam is normal and reassuring overall.  No signs of any serious spinal cord compression or nerve injury.  Your symptoms may represent some nerve irritation to the lower leg.  Take OTC Tylenol  as needed.  You should follow-up with the clinic for ongoing evaluation.  Return to the ED if needed.

## 2024-08-24 NOTE — ED Provider Notes (Signed)
 [facility] Emergency Department Provider Note     Event Date/Time   First MD Initiated Contact with Patient 08/24/24 2014     (approximate)   History   Tingling   HPI  Lauren Adams is a 23 y.o. female G1P0 at approximately [redacted] weeks gestation with a history of depression, seizures, and anxiety, and depression; presents to the ED endorsing left leg tingling sensation of the last 2 hours.  She localizes the sensation changes to the anterior left leg primarily from the knee to the shin.  She denies any frank weakness or numbness.  She denies any injury, trauma, fall.  No chest pain, shortness of breath, or syncope reported.  No bladder or bowel incontinence, foot drop, or saddle anesthesias reported.   Physical Exam   Triage Vital Signs: ED Triage Vitals [08/24/24 1743]  Encounter Vitals Group     BP 106/72     Girls Systolic BP Percentile      Girls Diastolic BP Percentile      Boys Systolic BP Percentile      Boys Diastolic BP Percentile      Pulse Rate (!) 58     Resp 18     Temp 98.5 F (36.9 C)     Temp Source Oral     SpO2 100 %     Weight 145 lb (65.8 kg)     Height 5' 6 (1.676 m)     Head Circumference      Peak Flow      Pain Score 0     Pain Loc      Pain Education      Exclude from Growth Chart     Most recent vital signs: Vitals:   08/24/24 1743 08/24/24 2147  BP: 106/72 102/66  Pulse: (!) 58 72  Resp: 18 16  Temp: 98.5 F (36.9 C) 98.5 F (36.9 C)  SpO2: 100% 100%    General Awake, no distress. NAD HEENT NCAT. PERRL. EOMI. No rhinorrhea. Mucous membranes are moist. CV:  Good peripheral perfusion. RRR. No CCE distally RESP:  Normal effort.  ABD:  No distention.  MSK:  AROM of all extremities.  Tender to palpation to the cervical thoracic, lumbar spine in the midline. NEURO: Cranial nerves II to XII grossly intact.  Normal UE/LE DTRs bilaterally.  Normal toe dorsiflexion foot eversion on exam.  Normal gross  sensation intact.  Negative seated straight leg raise bilaterally.   ED Results / Procedures / Treatments   Labs (all labs ordered are listed, but only abnormal results are displayed) Labs Reviewed - No data to display   EKG   RADIOLOGY  No results found.   PROCEDURES:  Critical Care performed: No  Procedures   MEDICATIONS ORDERED IN ED: Medications - No data to display   IMPRESSION / MDM / ASSESSMENT AND PLAN / ED COURSE  I reviewed the triage vital signs and the nursing notes.                              Differential diagnosis includes, but is not limited to, radiculopathy, paresthesias, contact dermatitis, myalgias  Patient's presentation is most consistent with acute, uncomplicated illness.  Patient's diagnosis is consistent with paresthesias to the LLE anterior leg.  Patient denies any recent injury, trauma, or fall.  No red flags on exam.  No skin changes noted.  Neurologically patient is intact without any gross  weakness.  Patient with paresthesias in the L4-5 dermatome to the left lower extremity on presentation.  Patient is currently approximately [redacted] weeks gestation, with a pregnancy of an unclear ongoing management plan.  We discussed options for imaging including x-rays and CT scans of the lumbar spine.  Patient declined at this time.  I advised to continue to monitor symptoms and to follow-up with her primary provider or this ED should symptoms worsen.  She is advised to take OTC Tylenol  as needed.  Patient is to follow up with her PCP as needed or otherwise directed. Patient is given ED precautions to return to the ED for any worsening or new symptoms.     FINAL CLINICAL IMPRESSION(S) / ED DIAGNOSES   Final diagnoses:  Paresthesia of left lower extremity     Rx / DC Orders   ED Discharge Orders     None        Note:  This document was prepared using Dragon voice recognition software and may include unintentional dictation errors.    Loyd Candida LULLA Aldona, PA-C 08/25/24 2358    Ernest Ronal BRAVO, MD 08/27/24 0730

## 2024-08-24 NOTE — ED Triage Notes (Addendum)
 Pt to ED for left leg tingling sensation x2 hours. Denies weakness or numbness. [redacted] weeks pregnant.  No orders at this time per Dr Floy

## 2024-09-04 DIAGNOSIS — O034 Incomplete spontaneous abortion without complication: Secondary | ICD-10-CM | POA: Diagnosis not present

## 2024-09-04 DIAGNOSIS — N8312 Corpus luteum cyst of left ovary: Secondary | ICD-10-CM | POA: Diagnosis not present

## 2024-09-04 DIAGNOSIS — R102 Pelvic and perineal pain: Secondary | ICD-10-CM | POA: Diagnosis not present

## 2024-09-04 DIAGNOSIS — O036 Delayed or excessive hemorrhage following complete or unspecified spontaneous abortion: Secondary | ICD-10-CM | POA: Diagnosis not present

## 2024-09-04 DIAGNOSIS — O0389 Complete or unspecified spontaneous abortion with other complications: Secondary | ICD-10-CM | POA: Diagnosis not present

## 2024-10-12 ENCOUNTER — Emergency Department

## 2024-10-12 ENCOUNTER — Emergency Department
Admission: EM | Admit: 2024-10-12 | Discharge: 2024-10-12 | Disposition: A | Discharge status: Home / Self Care | Attending: Emergency Medicine | Admitting: Emergency Medicine

## 2024-10-12 ENCOUNTER — Other Ambulatory Visit: Payer: Self-pay

## 2024-10-12 ENCOUNTER — Encounter: Payer: Self-pay | Admitting: Emergency Medicine

## 2024-10-12 DIAGNOSIS — M25511 Pain in right shoulder: Secondary | ICD-10-CM | POA: Insufficient documentation

## 2024-10-12 DIAGNOSIS — M79604 Pain in right leg: Secondary | ICD-10-CM | POA: Diagnosis not present

## 2024-10-12 DIAGNOSIS — M79605 Pain in left leg: Secondary | ICD-10-CM | POA: Diagnosis not present

## 2024-10-12 DIAGNOSIS — Y92410 Unspecified street and highway as the place of occurrence of the external cause: Secondary | ICD-10-CM | POA: Diagnosis not present

## 2024-10-12 DIAGNOSIS — S8012XA Contusion of left lower leg, initial encounter: Secondary | ICD-10-CM | POA: Insufficient documentation

## 2024-10-12 DIAGNOSIS — Z3A08 8 weeks gestation of pregnancy: Secondary | ICD-10-CM | POA: Diagnosis not present

## 2024-10-12 DIAGNOSIS — S8011XA Contusion of right lower leg, initial encounter: Secondary | ICD-10-CM | POA: Diagnosis not present

## 2024-10-12 DIAGNOSIS — S8992XA Unspecified injury of left lower leg, initial encounter: Secondary | ICD-10-CM | POA: Diagnosis not present

## 2024-10-12 DIAGNOSIS — O26891 Other specified pregnancy related conditions, first trimester: Secondary | ICD-10-CM | POA: Diagnosis not present

## 2024-10-12 DIAGNOSIS — Z743 Need for continuous supervision: Secondary | ICD-10-CM | POA: Diagnosis not present

## 2024-10-12 DIAGNOSIS — M79606 Pain in leg, unspecified: Secondary | ICD-10-CM | POA: Diagnosis not present

## 2024-10-12 DIAGNOSIS — S8991XA Unspecified injury of right lower leg, initial encounter: Secondary | ICD-10-CM | POA: Diagnosis not present

## 2024-10-12 MED ORDER — ACETAMINOPHEN 325 MG PO TABS
650.0000 mg | ORAL_TABLET | Freq: Once | ORAL | Status: AC
  Administered 2024-10-12: 650 mg via ORAL
  Filled 2024-10-12: qty 2

## 2024-10-12 MED ORDER — IBUPROFEN 800 MG PO TABS: 800.0000 mg | ORAL_TABLET | Freq: Three times a day (TID) | ORAL | 0 refills | Status: AC | PRN

## 2024-10-12 MED ORDER — METHOCARBAMOL 500 MG PO TABS: 500.0000 mg | ORAL_TABLET | Freq: Three times a day (TID) | ORAL | 0 refills | Status: AC | PRN

## 2024-10-12 NOTE — ED Notes (Signed)
 Pt discharged to home, instructions and medications reviewed.  Pt verbalized understanding, no questions at this time.

## 2024-10-12 NOTE — ED Notes (Signed)
 Patient family member to First Nurse desk inquiring about tylenol  for headache.
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
## 2024-10-12 NOTE — Discharge Instructions (Signed)
 You may alternate over the counter Tylenol 1000 mg every 6 hours as needed for pain, fever and Ibuprofen 800 mg every 6-8 hours as needed for pain, fever.  Please take Ibuprofen with food.  Do not take more than 4000 mg of Tylenol (acetaminophen) in a 24 hour period.

## 2024-10-17 DIAGNOSIS — Z419 Encounter for procedure for purposes other than remedying health state, unspecified: Secondary | ICD-10-CM | POA: Diagnosis not present

## 2024-11-16 DIAGNOSIS — Z419 Encounter for procedure for purposes other than remedying health state, unspecified: Secondary | ICD-10-CM | POA: Diagnosis not present

## 2024-12-10 ENCOUNTER — Ambulatory Visit
Admission: EM | Admit: 2024-12-10 | Discharge: 2024-12-10 | Disposition: A | Discharge status: Home / Self Care | Attending: Family Medicine | Admitting: Family Medicine

## 2024-12-10 ENCOUNTER — Encounter: Payer: Self-pay | Admitting: Emergency Medicine

## 2024-12-10 DIAGNOSIS — Z202 Contact with and (suspected) exposure to infections with a predominantly sexual mode of transmission: Secondary | ICD-10-CM | POA: Insufficient documentation

## 2024-12-10 DIAGNOSIS — N898 Other specified noninflammatory disorders of vagina: Secondary | ICD-10-CM | POA: Insufficient documentation

## 2024-12-10 LAB — HIV ANTIBODY (ROUTINE TESTING W REFLEX): HIV Screen 4th Generation wRfx: NONREACTIVE

## 2024-12-10 NOTE — Discharge Instructions (Addendum)
"   Your vaginal and blood test results will be available in the next 72 hours. If positive, someone will contact you.  You should see your results in your MyChart account.   "

## 2024-12-10 NOTE — ED Provider Notes (Signed)
 " [facility]    CSN: [id number] Arrival date & time: 12/10/24  1536      History   Chief Complaint Chief Complaint  Patient presents with   Vaginal Discharge     HPI HPI Lauren Adams is a 24 y.o. female.    Lauren Adams presents for milky white vaginal discharge for the past 3 days.  Tried nothing for symptoms prior to arrival.  Has not had any antibiotics in last 30 days. .  [name] does use condoms regularly. She is  not currently pregnant.  Patient's last menstrual period was 12/01/2024.    - Abnormal vaginal discharge: yes  - vaginal odor: no - vaginal bleeding: no - Dysuria: no - Hematuria: no - Urinary urgency:no  - Urinary frequency: no  - Fever: no - Abdominal pain: no  - Pelvic pain: no - Rash/Skin lesions/mouth ulcers: no - Nausea: no  - Vomiting: no  - Back Pain: not new        Past Medical History:  Diagnosis Date   Depression 2022   Eczema    Liver issues with birth control pills 07/10/2020   Seizures (HCC)     Patient Active Problem List   Diagnosis Date Noted   Major depressive disorder with single episode, in full remission 01/06/2021   Anxiety 01/06/2021   Liver issues with birth control pills 07/10/2020   Late period 07/10/2020   Angioedema 02/14/2019   Allergic reaction 02/14/2019   Allergic conjunctivitis 02/14/2019   Seasonal and perennial allergic rhinitis 02/14/2019   Seasonal and perennial allergic rhinitis 02/14/2019   Conversion disorder with abnormal movement    Adjustment disorder with other symptom    VISUAL ACUITY, DECREASED 09/09/2010   Constipation 09/09/2010    Past Surgical History:  Procedure Laterality Date   FOOT SURGERY Left    no surgical history      OB History     Gravida  1   Para  0   Term  0   Preterm  0   AB  0   Living  0      SAB  0   IAB  0   Ectopic  0   Multiple  0   Live Births  0            Home Medications    Prior to Admission  medications  Medication Sig Start Date End Date Taking? Authorizing Provider  methocarbamol  (ROBAXIN ) 500 MG tablet Take 1 tablet (500 mg total) by mouth every 8 (eight) hours as needed. 10/12/24  Yes Ward, Josette SAILOR, DO  ibuprofen  (ADVIL ) 800 MG tablet Take 1 tablet (800 mg total) by mouth every 8 (eight) hours as needed. 10/12/24   Ward, Josette SAILOR, DO    Family History Family History  Problem Relation Age of Onset   Carpal tunnel syndrome Mother    Hypertension Maternal Aunt    Kidney disease Maternal Aunt    Diabetes Maternal Aunt    Diabetes Maternal Grandmother    Hypertension Maternal Grandmother    Kidney disease Maternal Grandmother    Asthma Other    Eczema Other    Angioedema Neg Hx    Immunodeficiency Neg Hx    Urticaria Neg Hx     Social History Social History[1]   Allergies   Drug ingredient [bee pollen]   Review of Systems Review of Systems: :negative unless otherwise stated in HPI.      Physical Exam Triage Vital Signs  ED Triage Vitals  Encounter Vitals Group     BP 12/10/24 1624 103/66     Girls Systolic BP Percentile --      Girls Diastolic BP Percentile --      Boys Systolic BP Percentile --      Boys Diastolic BP Percentile --      Pulse Rate 12/10/24 1624 65     Resp 12/10/24 1624 17     Temp 12/10/24 1624 98.2 F (36.8 C)     Temp Source 12/10/24 1624 Oral     SpO2 12/10/24 1624 98 %     Weight 12/10/24 1623 158 lb (71.7 kg)     Height --      Head Circumference --      Peak Flow --      Pain Score 12/10/24 1622 0     Pain Loc --      Pain Education --      Exclude from Growth Chart --    No data found.  Updated Vital Signs BP 103/66 (BP Location: Right Arm)   Pulse 65   Temp 98.2 F (36.8 C) (Oral)   Resp 17   Wt 71.7 kg   LMP 12/01/2024   SpO2 98%   BMI 25.50 kg/m   Visual Acuity Right Eye Distance:   Left Eye Distance:   Bilateral Distance:    Right Eye Near:   Left Eye Near:    Bilateral Near:     Physical  Exam GEN: well appearing female in no acute distress  CVS: well perfused  RESP: speaking in full sentences without pause  ABD: soft, non-tender, non-distended, no palpable masses   GU: deferred, patient performed self swab    UC Treatments / Results  Labs (all labs ordered are listed, but only abnormal results are displayed) Labs Reviewed  HIV ANTIBODY (ROUTINE TESTING W REFLEX)  SYPHILIS: RPR W/REFLEX TO RPR TITER AND TREPONEMAL ANTIBODIES, TRADITIONAL SCREENING AND DIAGNOSIS ALGORITHM  CERVICOVAGINAL ANCILLARY ONLY    EKG   Radiology No results found.  Procedures Procedures (including critical care time)  Medications Ordered in UC Medications - No data to display  Initial Impression / Assessment and Plan / UC Course  I have reviewed the triage vital signs and the nursing notes.  Pertinent labs & imaging results that were available during my care of the patient were reviewed by me and considered in my medical decision making (see chart for details).      Patient is a 24 y.o.Lauren Adams female  who presents for vaginal discharge.  Overall patient is well-appearing and afebrile.  Vital signs stable. Vaginal swab for yeast vaginitis and bacterial vaginitis, trichomonas, gonorrhea and chlamydia obtained.  HIV and RPR requested.  Patient does not request prophylactic STI treatment. - Treatment: deferred pending results    Return precautions including abdominal pain, fever, chills, nausea, or vomiting given. Discussed MDM, treatment plan and plan for follow-up with patient who agrees with plan.      Final Clinical Impressions(s) / UC Diagnoses   Final diagnoses:  Vaginal discharge  Possible exposure to STD     Discharge Instructions       Your vaginal and blood test results will be available in the next 72 hours. If positive, someone will contact you.  You should see your results in your MyChart account.       ED Prescriptions   None    PDMP not reviewed this  encounter.    [1]  Social  History Tobacco Use   Smoking status: Never   Smokeless tobacco: Never  Vaping Use   Vaping status: Former  Substance Use Topics   Alcohol use: Not Currently    Comment: occasionally   Drug use: Not Currently    Types: Marijuana     Lauren Berth, DO 12/10/24 1637  "

## 2024-12-10 NOTE — ED Triage Notes (Signed)
 Pt presents with vaginal discharge x 2 days. Pt would like STD testing.

## 2024-12-11 ENCOUNTER — Ambulatory Visit (HOSPITAL_COMMUNITY): Payer: Self-pay

## 2024-12-11 LAB — SYPHILIS: RPR W/REFLEX TO RPR TITER AND TREPONEMAL ANTIBODIES, TRADITIONAL SCREENING AND DIAGNOSIS ALGORITHM: RPR Ser Ql: NONREACTIVE

## 2024-12-11 LAB — CERVICOVAGINAL ANCILLARY ONLY
Bacterial Vaginitis (gardnerella): POSITIVE — AB
Candida Glabrata: NEGATIVE
Candida Vaginitis: NEGATIVE
Chlamydia: NEGATIVE
Comment: NEGATIVE
Comment: NEGATIVE
Comment: NEGATIVE
Comment: NEGATIVE
Comment: NEGATIVE
Comment: NORMAL
Neisseria Gonorrhea: NEGATIVE
Trichomonas: NEGATIVE

## 2024-12-11 MED ORDER — METRONIDAZOLE 0.75 % VA GEL: 1.0000 | Freq: Every day | VAGINAL | 0 refills | Status: AC

## 2024-12-11 MED ORDER — METRONIDAZOLE 500 MG PO TABS: 500.0000 mg | ORAL_TABLET | Freq: Two times a day (BID) | ORAL | 0 refills | Status: AC
